# Patient Record
Sex: Male | Born: 1960 | Race: White | Hispanic: No | Smoking: Current every day smoker
Health system: Southern US, Community
[De-identification: ages and names within clinical notes are randomized; demographics above are authoritative.]

## PROBLEM LIST (undated history)

## (undated) DIAGNOSIS — I10 Essential (primary) hypertension: Secondary | ICD-10-CM

## (undated) DIAGNOSIS — B1920 Unspecified viral hepatitis C without hepatic coma: Secondary | ICD-10-CM

## (undated) DIAGNOSIS — J449 Chronic obstructive pulmonary disease, unspecified: Secondary | ICD-10-CM

## (undated) DIAGNOSIS — F1120 Opioid dependence, uncomplicated: Secondary | ICD-10-CM

## (undated) DIAGNOSIS — F1010 Alcohol abuse, uncomplicated: Secondary | ICD-10-CM

## (undated) HISTORY — PX: KNEE SURGERY: SHX244

## (undated) HISTORY — PX: CHOLECYSTECTOMY: SHX55

## (undated) HISTORY — DX: Alcohol abuse, uncomplicated: F10.10

## (undated) HISTORY — DX: Unspecified viral hepatitis C without hepatic coma: B19.20

## (undated) HISTORY — DX: Chronic obstructive pulmonary disease, unspecified: J44.9

---

## 2002-10-27 ENCOUNTER — Ambulatory Visit (HOSPITAL_COMMUNITY): Admission: RE | Admit: 2002-10-27 | Discharge: 2002-10-27 | Payer: Self-pay | Admitting: Orthopaedic Surgery

## 2002-10-27 ENCOUNTER — Encounter: Payer: Self-pay | Admitting: Orthopaedic Surgery

## 2003-11-08 ENCOUNTER — Emergency Department (HOSPITAL_COMMUNITY): Admission: EM | Admit: 2003-11-08 | Discharge: 2003-11-08 | Payer: Self-pay | Admitting: Emergency Medicine

## 2004-08-05 ENCOUNTER — Emergency Department (HOSPITAL_COMMUNITY): Admission: EM | Admit: 2004-08-05 | Discharge: 2004-08-05 | Payer: Self-pay | Admitting: Emergency Medicine

## 2007-07-14 ENCOUNTER — Ambulatory Visit (HOSPITAL_COMMUNITY): Admission: RE | Admit: 2007-07-14 | Discharge: 2007-07-14 | Payer: Self-pay | Admitting: Family Medicine

## 2010-06-22 NOTE — H&P (Signed)
NAME:  Levi Campbell, Levi Campbell NO.:  [id number]   MEDICAL RECORD NO.:  [id number]          PATIENT TYPE:  EMS   LOCATION:  [facility]                         FACILITY:  [facility]   PHYSICIAN:  Trudi Ida. Denton Lank, M.D.  DATE OF BIRTH:  20-Apr-1960   DATE OF ADMISSION:  11/08/2003  DATE OF DISCHARGE:                                HISTORY & PHYSICAL   ADDENDUM TO A T SYSTEM CHART:   DATE OF VISIT:  November 08, 2003   TIME OF RE-EVALUATION:  1435   The patient had presented with complaint of fall at work.  He indicates he  fell approximately 5 or 6 feet, landing on his back.  He indicates he had  grabbed the edge of a gutter and cut his left wrist.  Minor bleeding had  stopped with direct pressure prior to arrival to the ED.  The patient did  complain of mid back pain.  No radiation.  No leg numbness or weakness.   PHYSICAL EXAMINATION:  BACK:  Significant for diffuse tenderness in the mid  to lower thoracic spine area.  EXTREMITIES:  A laceration on the volar aspect of the left wrist.   As the patient was tender over his spine, x-rays of the thoracic spine were  ordered.  At this point, I had gone to examine other patients, waiting for  those x-ray results to return.  I also ordered that a suture tray and cart  be brought to the room so that the wound could be sutured.  Upon returning  to reevaluate the patient, including to check on the results of his x-rays,  the patient was not in the room.  The nurse at that point indicated the  patient had become agitated about the wait, had needed to smoke.  She  offered to take him outside to let him smoke and bring him right back to the  room; however, the patient had refused that offer.  She also had, by her  report, offered to come and notify the physician; however, the patient also  opted not to wait for that.  When I entered the room to check on the  patient, I found the room was vacant.  I went to the triage area and waiting  room, and  the patient was not out at there.  At that point, the nurse had  informed me that he had left the ED a few minutes prior.  Nursing at that  point also indicated that he had refused his x-rays.   The patient left the ED prior to completion of medical evaluation or  treatment against medical advice, and the patient had left the ED prior to  myself being notified that either he had refused x-rays or that he had  planned on leaving the ED prior to completion of suturing and full  evaluation.       KES/MEDQ  D:  11/08/2003  T:  11/08/2003  Job:  [id number]

## 2012-11-10 ENCOUNTER — Emergency Department (HOSPITAL_COMMUNITY)
Admission: EM | Admit: 2012-11-10 | Discharge: 2012-11-10 | Disposition: A | Discharge status: Home / Self Care | Payer: Self-pay | Attending: Emergency Medicine | Admitting: Emergency Medicine

## 2012-11-10 ENCOUNTER — Emergency Department (HOSPITAL_COMMUNITY): Payer: Self-pay

## 2012-11-10 ENCOUNTER — Encounter (HOSPITAL_COMMUNITY): Payer: Self-pay | Admitting: *Deleted

## 2012-11-10 DIAGNOSIS — Z9889 Other specified postprocedural states: Secondary | ICD-10-CM | POA: Insufficient documentation

## 2012-11-10 DIAGNOSIS — Z87828 Personal history of other (healed) physical injury and trauma: Secondary | ICD-10-CM | POA: Insufficient documentation

## 2012-11-10 DIAGNOSIS — IMO0002 Reserved for concepts with insufficient information to code with codable children: Secondary | ICD-10-CM | POA: Insufficient documentation

## 2012-11-10 DIAGNOSIS — F17200 Nicotine dependence, unspecified, uncomplicated: Secondary | ICD-10-CM | POA: Insufficient documentation

## 2012-11-10 DIAGNOSIS — M1710 Unilateral primary osteoarthritis, unspecified knee: Secondary | ICD-10-CM

## 2012-11-10 MED ORDER — DEXAMETHASONE SODIUM PHOSPHATE 4 MG/ML IJ SOLN
8.0000 mg | Freq: Once | INTRAMUSCULAR | Status: AC
  Administered 2012-11-10: 8 mg via INTRAMUSCULAR
  Filled 2012-11-10: qty 2

## 2012-11-10 MED ORDER — KETOROLAC TROMETHAMINE 10 MG PO TABS
10.0000 mg | ORAL_TABLET | Freq: Once | ORAL | Status: AC
  Administered 2012-11-10: 10 mg via ORAL
  Filled 2012-11-10: qty 1

## 2012-11-10 MED ORDER — DEXAMETHASONE 6 MG PO TABS: ORAL_TABLET | ORAL | Status: DC

## 2012-11-10 MED ORDER — DICLOFENAC SODIUM 75 MG PO TBEC: 75.0000 mg | DELAYED_RELEASE_TABLET | Freq: Two times a day (BID) | ORAL | Status: DC

## 2012-11-10 NOTE — ED Provider Notes (Signed)
CSN: [id number]     Arrival date & time 11/10/12  1427 History   First MD Initiated Contact with Patient 11/10/12 1511     Chief Complaint  Patient presents with  . Knee Pain   (Consider location/radiation/quality/duration/timing/severity/associated sxs/prior Treatment) HPI Comments: Pt is a 52 y/o male who present to the ED with c/o left knee pain. Pt has hx of injury to the left knee with tendon rupture in 1988. He has hx of arthritis. 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 No new injury. Pt c/o pain of the back and medial aspect of the knee. The pain is worse with standing or walking.  Patient is a 52 y.o. male presenting with knee pain.  Knee Pain   History reviewed. No pertinent past medical history. Past Surgical History  Procedure Laterality Date  . Cholecystectomy    . Knee surgery Left    History reviewed. No pertinent family history. History  Substance Use Topics  . Smoking status: Current Every Day Smoker -- 1.50 packs/day  . Smokeless tobacco: Not on file  . Alcohol Use: 0.0 oz/week    2-4 Cans of beer per week     Comment: daily    Review of Systems  Musculoskeletal: Positive for arthralgias.    Allergies  Codeine  Home Medications   Current Outpatient Rx  Name  Route  Sig  Dispense  Refill  . traMADol (ULTRAM) 50 MG tablet   Oral   Take 50 mg by mouth every 6 (six) hours as needed for pain.          BP 122/80  Pulse 78  Temp(Src) 97.9 F (36.6 C) (Oral)  Resp 17  Ht 5\' 10"  (1.778 m)  Wt 217 lb (98.431 kg)  BMI 31.14 kg/m2  SpO2 98% Physical Exam  Nursing note and vitals reviewed. Constitutional: He is oriented to person, place, and time. He appears well-developed and well-nourished.  Non-toxic appearance.  HENT:  Head: Normocephalic.  Right Ear: Tympanic membrane and external ear normal.  Left Ear: Tympanic membrane and external ear normal.  Eyes: EOM and lids are normal. Pupils are equal, round, and reactive to light.  Neck: Normal range  of motion. Neck supple. Carotid bruit is not present.  Cardiovascular: Normal rate, regular rhythm, normal heart sounds, intact distal pulses and normal pulses.   Pulmonary/Chest: Breath sounds normal. No respiratory distress.  Abdominal: Soft. Bowel sounds are normal. There is no tenderness. There is no guarding.  Musculoskeletal: Normal range of motion.  There is good range of motion of the left hip. There is an effusion of the left knee. There is crepitus noted of the the left knee with attempted flexion and extension. The patella is in the midline. There is tenderness but no deformity of the anterior tibial tuberosity. There is no posterior mass appreciated. There's no deformity of the tib-fib area. The Achilles tendon is intact. The dorsalis pedis pulses 2+. The left knee is warmer to touch than the right.  Lymphadenopathy:       Head (right side): No submandibular adenopathy present.       Head (left side): No submandibular adenopathy present.    He has no cervical adenopathy.  Neurological: He is alert and oriented to person, place, and time. He has normal strength. No cranial nerve deficit or sensory deficit.  Skin: Skin is warm and dry.  Psychiatric: He has a normal mood and affect. His speech is normal.    ED Course  Procedures (  including critical care time) Labs Review Labs Reviewed - No data to display Imaging Review No results found. Pulse Ox 98% on room air. WNL by my interpretation. MDM  No diagnosis found. *I have reviewed nursing notes, vital signs, and all appropriate lab and imaging results for this patient.**  X-ray of the left knee reveals narrowing of the medial joint compartment. There is spurring of the medial femoral condyle and medial tibial plateau. There is mild narrowing of the patellofemoral joint space. There is also a joint effusion present. No fracture appreciated.  Findings discussed with the patient. The plan at this time is for the patient to see Dr.  Romeo Apple, orthopedics surgery, as sone as possible for possible draining of the fluid from the knee, and also for a plan of treatment concerning the knee. The patient is given a prescription for Decadron 2 times daily, and diclofenac 2 times daily. Patient is fitted with a knee immobilizer and crutches. He is advised not to do any climbing until cleared by orthopedics.  Kathie Dike, PA-C 11/10/12 1550

## 2012-11-10 NOTE — ED Notes (Addendum)
L posterior and medial knee pain began 1 week ago.  Denies any injury.  In 1988 had arthroscopic surgery on same knee.  Early arthritis, air pocket behind patella and some tendon rupture.  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  No obvious swelling noted, but tender to palpation at medial and posterior knee.

## 2012-11-10 NOTE — ED Provider Notes (Signed)
Medical screening examination/treatment/procedure(s) were performed by non-physician practitioner and as supervising physician I was immediately available for consultation/collaboration.  Flint Melter, MD 11/10/12 [phone]

## 2014-10-03 ENCOUNTER — Other Ambulatory Visit (HOSPITAL_COMMUNITY): Payer: Self-pay | Admitting: Family Medicine

## 2014-10-03 ENCOUNTER — Ambulatory Visit (HOSPITAL_COMMUNITY)
Admission: RE | Admit: 2014-10-03 | Discharge: 2014-10-03 | Disposition: A | Discharge status: Home / Self Care | Payer: Self-pay | Source: Ambulatory Visit | Attending: Family Medicine | Admitting: Family Medicine

## 2014-10-03 DIAGNOSIS — G8929 Other chronic pain: Secondary | ICD-10-CM | POA: Insufficient documentation

## 2014-10-03 DIAGNOSIS — I70201 Unspecified atherosclerosis of native arteries of extremities, right leg: Secondary | ICD-10-CM | POA: Insufficient documentation

## 2014-10-03 DIAGNOSIS — M25561 Pain in right knee: Secondary | ICD-10-CM | POA: Insufficient documentation

## 2014-10-03 DIAGNOSIS — M25562 Pain in left knee: Secondary | ICD-10-CM | POA: Insufficient documentation

## 2014-10-03 DIAGNOSIS — M170 Bilateral primary osteoarthritis of knee: Secondary | ICD-10-CM | POA: Insufficient documentation

## 2014-10-03 DIAGNOSIS — M25511 Pain in right shoulder: Secondary | ICD-10-CM | POA: Insufficient documentation

## 2014-11-17 ENCOUNTER — Encounter: Payer: Self-pay | Admitting: Orthopedic Surgery

## 2021-02-05 ENCOUNTER — Emergency Department (HOSPITAL_COMMUNITY)
Admission: EM | Admit: 2021-02-05 | Discharge: 2021-02-05 | Disposition: A | Discharge status: Home / Self Care | Payer: Medicaid Other | Attending: Emergency Medicine | Admitting: Emergency Medicine

## 2021-02-05 ENCOUNTER — Emergency Department (HOSPITAL_COMMUNITY): Payer: Medicaid Other

## 2021-02-05 ENCOUNTER — Encounter (HOSPITAL_COMMUNITY): Payer: Self-pay | Admitting: *Deleted

## 2021-02-05 DIAGNOSIS — N452 Orchitis: Secondary | ICD-10-CM

## 2021-02-05 DIAGNOSIS — N433 Hydrocele, unspecified: Secondary | ICD-10-CM | POA: Insufficient documentation

## 2021-02-05 DIAGNOSIS — N5082 Scrotal pain: Secondary | ICD-10-CM | POA: Diagnosis present

## 2021-02-05 HISTORY — DX: Essential (primary) hypertension: I10

## 2021-02-05 LAB — URINALYSIS, ROUTINE W REFLEX MICROSCOPIC
Bilirubin Urine: NEGATIVE
Glucose, UA: NEGATIVE mg/dL
Ketones, ur: NEGATIVE mg/dL
Nitrite: NEGATIVE
Protein, ur: NEGATIVE mg/dL
Specific Gravity, Urine: 1.021 (ref 1.005–1.030)
pH: 6 (ref 5.0–8.0)

## 2021-02-05 MED ORDER — NAPROXEN 500 MG PO TABS: 500.0000 mg | ORAL_TABLET | Freq: Two times a day (BID) | ORAL | 0 refills | Status: DC

## 2021-02-05 MED ORDER — LIDOCAINE HCL (PF) 1 % IJ SOLN
1.0000 mL | Freq: Once | INTRAMUSCULAR | Status: AC
  Administered 2021-02-05: 1 mL
  Filled 2021-02-05: qty 30

## 2021-02-05 MED ORDER — KETOROLAC TROMETHAMINE 30 MG/ML IJ SOLN
15.0000 mg | Freq: Once | INTRAMUSCULAR | Status: AC
  Administered 2021-02-05: 15 mg via INTRAMUSCULAR
  Filled 2021-02-05: qty 1

## 2021-02-05 MED ORDER — DOXYCYCLINE HYCLATE 100 MG PO TABS
100.0000 mg | ORAL_TABLET | Freq: Once | ORAL | Status: AC
  Administered 2021-02-05: 100 mg via ORAL
  Filled 2021-02-05 (×2): qty 1

## 2021-02-05 MED ORDER — CEFTRIAXONE SODIUM 500 MG IJ SOLR
500.0000 mg | Freq: Once | INTRAMUSCULAR | Status: AC
  Administered 2021-02-05: 500 mg via INTRAMUSCULAR
  Filled 2021-02-05: qty 500

## 2021-02-05 MED ORDER — DOXYCYCLINE HYCLATE 100 MG PO CAPS: 100.0000 mg | ORAL_CAPSULE | Freq: Two times a day (BID) | ORAL | 0 refills | Status: DC

## 2021-02-05 NOTE — Discharge Instructions (Signed)
Take antibiotics as prescribed.  Take the entire course, even if symptoms improve. Take naproxen 2 times a day with meals.  Do not take other anti-inflammatories at the same time (Advil, Motrin, ibuprofen, Aleve). You may supplement with Tylenol if you need further pain control. Use ice packs for pain. Try and keep your scrotum elevated to help with discomfort. Follow-up with a urologist if your symptoms not proving. Return to the emergency room with any new, worsening, concerning symptoms.

## 2021-02-05 NOTE — ED Notes (Signed)
Sudden pain to scrotum for a week, pain has gotten worse per pt.  Pt denies any injury to scrotum and denies any penile discharge.

## 2021-02-05 NOTE — ED Provider Notes (Signed)
[facility] EMERGENCY DEPARTMENT Provider Note   CSN: [id number] Arrival date & time: 02/05/21  1448     History  Chief Complaint  Patient presents with   Groin Pain    Levi PeonRobert K Campbell is a 61 y.o. male presenting for evaluation of scrotal pain.  Patient states he developed acute onset left scrotal pain a week ago.  Has been constant and worsening since.  Pain is worse when he urinates.  He has not taken anything for his symptoms.  He denies fevers, chills, nausea, vomiting.  He is sexually active with 1 male partner, they do not use condoms.  She does not any symptoms.  He has never had symptoms like this before.  No penile discharge.  HPI     Home Medications Prior to Admission medications   Medication Sig Start Date End Date Taking? Authorizing Provider  doxycycline (VIBRAMYCIN) 100 MG capsule Take 1 capsule (100 mg total) by mouth 2 (two) times daily. 02/05/21  Yes Ruqayya Ventress, PA-C  naproxen (NAPROSYN) 500 MG tablet Take 1 tablet (500 mg total) by mouth 2 (two) times daily with a meal. 02/05/21  Yes Keyonni Percival, PA-C  dexamethasone (DECADRON) 6 MG tablet 1 po bid with food 11/10/12   Ivery QualeBryant, Hobson, PA-C  diclofenac (VOLTAREN) 75 MG EC tablet Take 1 tablet (75 mg total) by mouth 2 (two) times daily. 11/10/12   Ivery QualeBryant, Hobson, PA-C  traMADol (ULTRAM) 50 MG tablet Take 50 mg by mouth every 6 (six) hours as needed for pain.    [provider]      Allergies    Codeine    Review of Systems   Review of Systems  Genitourinary:  Positive for testicular pain.  All other systems reviewed and are negative.  Physical Exam Updated Vital Signs BP (!) 127/97 (BP Location: Right Arm)    Pulse 71    Temp 97.8 F (36.6 C) (Oral)    Resp 20    SpO2 100%  Physical Exam Vitals and nursing note reviewed. Exam conducted with a chaperone present.  Constitutional:      General: He is not in acute distress.    Appearance: Normal appearance.     Comments: nontoxic  HENT:      Head: Normocephalic and atraumatic.  Eyes:     Conjunctiva/sclera: Conjunctivae normal.     Pupils: Pupils are equal, round, and reactive to light.  Cardiovascular:     Rate and Rhythm: Normal rate and regular rhythm.     Pulses: Normal pulses.  Pulmonary:     Effort: Pulmonary effort is normal. No respiratory distress.     Breath sounds: Normal breath sounds. No wheezing.     Comments: Speaking in full sentences.  Clear lung sounds in all fields. Abdominal:     General: There is no distension.     Palpations: Abdomen is soft. There is no mass.     Tenderness: There is no abdominal tenderness. There is no guarding or rebound.     Comments: No TTP of the abdomen.  No rigidity, guarding, distention.  Negative CVA tenderness  Genitourinary:    Comments: Significant tenderness palpation of left testicle.  Slightly more swollen than the right.  No lesions noted.  No penile discharge.  No inguinal lymphadenopathy. Musculoskeletal:        General: Normal range of motion.     Cervical back: Normal range of motion and neck supple.  Skin:    General: Skin is warm and dry.  Capillary Refill: Capillary refill takes less than 2 seconds.  Neurological:     Mental Status: He is alert and oriented to person, place, and time.  Psychiatric:        Mood and Affect: Mood and affect normal.        Speech: Speech normal.        Behavior: Behavior normal.    ED Results / Procedures / Treatments   Labs (all labs ordered are listed, but only abnormal results are displayed) Labs Reviewed  URINALYSIS, ROUTINE W REFLEX MICROSCOPIC - Abnormal; Notable for the following components:      Result Value   APPearance HAZY (*)    Hgb urine dipstick SMALL (*)    Leukocytes,Ua TRACE (*)    Bacteria, UA RARE (*)    All other components within normal limits  URINE CULTURE  GC/CHLAMYDIA PROBE AMP ([place]) NOT AT [facility]    EKG None  Radiology US SCROTUM W/DOPPLER  Result Date:  02/05/2021 CLINICAL DATA:  Left scrotal pain. EXAM: SCROTAL ULTRASOUND DOPPLER ULTRASOUND OF THE TESTICLES TECHNIQUE: Complete ultrasound examination of the testicles, epididymis, and other scrotal structures was performed. Color and spectral Doppler ultrasound were also utilized to evaluate blood flow to the testicles. COMPARISON:  None. FINDINGS: Right testicle Measurements: 5.2 x 2.6 x 2.8 cm. No mass or microlithiasis visualized. Left testicle Measurements: 4.5 x 2.5 x 3.1 cm. No mass or microlithiasis visualized. Right epididymis:  Normal in size and appearance. Left epididymis: Normal in size and appearance. Increased vascular Doppler flow. Hydrocele:  Small left hydrocele. Varicocele:  None visualized. Pulsed Doppler interrogation of both testes demonstrates normal low resistance arterial and venous waveforms bilaterally. There is increased vascularity throughout the left testicle. IMPRESSION: 1. Findings compatible with left-sided epididymal orchitis. 2. Small left hydrocele. Electronically Signed   By: Darliss Cheney M.D.   On: 02/05/2021 16:33    Procedures Procedures    Medications Ordered in ED Medications  cefTRIAXone (ROCEPHIN) injection 500 mg (500 mg Intramuscular Given 02/05/21 1840)  lidocaine (PF) (XYLOCAINE) 1 % injection 1 mL (1 mL Other Given 02/05/21 1842)  ketorolac (TORADOL) 30 MG/ML injection 15 mg (15 mg Intramuscular Given 02/05/21 1838)  doxycycline (VIBRA-TABS) tablet 100 mg (100 mg Oral Given 02/05/21 1847)    ED Course/ Medical Decision Making/ A&P                           Medical Decision Making   This patient presents to the ED for concern of scrotal pain. This involves a number of treatment options, and is a complaint that carries with it a moderate risk of complications and morbidity.  The differential diagnosis includes torsion, orchitis/epididymitis, STI, kidney stone, UTI, hernia  Lab Tests:  I ordered, and personally interpreted labs.  The pertinent results  include: Urine no obvious signs of infection, there is only trace leukocytes and rare bacteria.  There is a small amount of blood, however low suspicion for kidney stone based on exam and history.   Imaging Studies:  I ordered imaging studies including scrotal ultrasound to rule out torsion. I independently visualized and interpreted imaging which showed no torsion.  Per radiologist, most consistent with orchitis with a small left hydrococele. I agree with the radiologist interpretation   Medicines ordered:  I ordered medication including abx and nsaids for infection and pain Reevaluation of the patient after these medicines showed that the patient stayed the same  Dispostion:  After  consideration of the diagnostic results and the patients response to treatment, I feel that the patent would benefit from management at home with antibiotics and pain control.  Patient given outpatient follow-up information with urology.  Discussed etiologies with patient including STIs, he is aware that he has results that are pending and will need to inform his partner if they are positive.  At this time, patient appears safe for discharge.  Return precautions given.  Patient states he understands and agrees to plan.  Final Clinical Impression(s) / ED Diagnoses Final diagnoses:  Orchitis  Left hydrocele    Rx / DC Orders ED Discharge Orders          Ordered    doxycycline (VIBRAMYCIN) 100 MG capsule  2 times daily        02/05/21 1826    naproxen (NAPROSYN) 500 MG tablet  2 times daily with meals        02/05/21 1826              Alveria Apley, PA-C 02/05/21 1956    Pollyann Savoy, MD 02/05/21 2147

## 2021-02-05 NOTE — ED Triage Notes (Signed)
Pain in left side of groin for over a week

## 2021-02-06 LAB — GC/CHLAMYDIA PROBE AMP (~~LOC~~) NOT AT ARMC
Chlamydia: NEGATIVE
Comment: NEGATIVE
Comment: NORMAL
Neisseria Gonorrhea: NEGATIVE

## 2021-02-07 LAB — URINE CULTURE: Culture: <10000 — AB

## 2021-04-05 ENCOUNTER — Emergency Department (HOSPITAL_COMMUNITY): Payer: Medicaid Other

## 2021-04-05 ENCOUNTER — Encounter (HOSPITAL_COMMUNITY): Payer: Self-pay

## 2021-04-05 ENCOUNTER — Other Ambulatory Visit: Payer: Self-pay

## 2021-04-05 ENCOUNTER — Emergency Department (HOSPITAL_COMMUNITY)
Admission: EM | Admit: 2021-04-05 | Discharge: 2021-04-05 | Disposition: A | Discharge status: Home / Self Care | Payer: Medicaid Other | Attending: Emergency Medicine | Admitting: Emergency Medicine

## 2021-04-05 DIAGNOSIS — W230XXA Caught, crushed, jammed, or pinched between moving objects, initial encounter: Secondary | ICD-10-CM | POA: Diagnosis not present

## 2021-04-05 DIAGNOSIS — S67190A Crushing injury of right index finger, initial encounter: Secondary | ICD-10-CM | POA: Insufficient documentation

## 2021-04-05 DIAGNOSIS — Z5321 Procedure and treatment not carried out due to patient leaving prior to being seen by health care provider: Secondary | ICD-10-CM | POA: Insufficient documentation

## 2021-04-05 HISTORY — DX: Opioid dependence, uncomplicated: F11.20

## 2021-04-05 NOTE — ED Triage Notes (Signed)
Reports smashed finger in dirty door jam and now has infection in right 2nd digit.  Patient reports went to UC and was on antibiotics for other wounds and finger started swelling yesterday and then it started draining today.  ?

## 2021-05-16 ENCOUNTER — Encounter: Payer: Self-pay | Admitting: *Deleted

## 2021-07-11 ENCOUNTER — Encounter: Payer: Self-pay | Admitting: *Deleted

## 2021-07-19 ENCOUNTER — Ambulatory Visit: Payer: Medicaid Other

## 2022-01-08 ENCOUNTER — Encounter: Payer: Self-pay | Admitting: *Deleted

## 2022-05-13 DIAGNOSIS — K621 Rectal polyp: Secondary | ICD-10-CM | POA: Insufficient documentation

## 2022-07-31 DIAGNOSIS — K5790 Diverticulosis of intestine, part unspecified, without perforation or abscess without bleeding: Secondary | ICD-10-CM | POA: Insufficient documentation

## 2023-01-08 ENCOUNTER — Ambulatory Visit (INDEPENDENT_AMBULATORY_CARE_PROVIDER_SITE_OTHER): Payer: MEDICAID | Admitting: Gastroenterology

## 2023-01-08 ENCOUNTER — Encounter: Payer: Self-pay | Admitting: Gastroenterology

## 2023-01-08 VITALS — BP 136/87 | HR 116 | Temp 97.7°F | Ht 69.0 in | Wt 235.8 lb

## 2023-01-08 DIAGNOSIS — I872 Venous insufficiency (chronic) (peripheral): Secondary | ICD-10-CM | POA: Insufficient documentation

## 2023-01-08 DIAGNOSIS — B182 Chronic viral hepatitis C: Secondary | ICD-10-CM | POA: Diagnosis not present

## 2023-01-08 DIAGNOSIS — K219 Gastro-esophageal reflux disease without esophagitis: Secondary | ICD-10-CM | POA: Insufficient documentation

## 2023-01-08 DIAGNOSIS — F17210 Nicotine dependence, cigarettes, uncomplicated: Secondary | ICD-10-CM | POA: Insufficient documentation

## 2023-01-08 DIAGNOSIS — K5900 Constipation, unspecified: Secondary | ICD-10-CM

## 2023-01-08 DIAGNOSIS — F17200 Nicotine dependence, unspecified, uncomplicated: Secondary | ICD-10-CM | POA: Insufficient documentation

## 2023-01-08 MED ORDER — FAMOTIDINE 40 MG PO TABS
40.0000 mg | ORAL_TABLET | Freq: Two times a day (BID) | ORAL | 3 refills | Status: DC
Start: 2023-01-08 — End: 2023-02-11

## 2023-01-08 NOTE — Patient Instructions (Addendum)
Follow a GERD diet:  Avoid fried, fatty, greasy, spicy, citrus foods. Avoid caffeine and carbonated beverages. Avoid chocolate. Try eating 4-6 small meals a day rather than 3 large meals. Do not eat within 3 hours of laying down. Prop head of bed up on wood or bricks to create a 6 inch incline.  Please continue to avoid any Aleve, Advil, ibuprofen, BC or Goody powders.   Please stop taking over-the-counter omeprazole.  You need to be off of this while taking Epclusa.  I have sent in famotidine for you to take 40 mg once daily.  Please try this for 1-2 weeks and if you are not seeing any improvement then you can increase to twice a day.  If after trying this for several weeks you are not having any relief please let me know so I can instruct you better on any medication that you can take for breakthrough.   On 12/19 or after you can go to the lab and have your H. pylori breath test performed.  This is to rule out a bacteria in your stomach that could be contributing to acid reflux.  We will follow-up in 2 months.  If H. pylori is negative and you continue to have acid reflux symptoms we will work towards scheduling your upper endoscopy at that time that way we can get clearance from pulmonology who you will be seeing in January.  It was a pleasure to see you today. I want to create trusting relationships with patients. If you receive a survey regarding your visit,  I greatly appreciate you taking time to fill this out on paper or through your MyChart. I value your feedback.  Brooke Bonito, MSN, FNP-BC, AGACNP-BC [facility]

## 2023-01-08 NOTE — Progress Notes (Signed)
GI Office Note    Referring Provider: Toma Deiters, MD Primary Care Physician:  Toma Deiters, MD  Primary Gastroenterologist: Hennie Duos. Marletta Lor, DO  Chief Complaint   Chief Complaint  Patient presents with   Gastroesophageal Reflux    Heart burn all the time    History of Present Illness   Levi Campbell is a 62 y.o. male presenting today at the request of Hasanaj, Myra Gianotti, MD for GERD.   Per review of referral paperwork he has noted a medical history of COPD, not currently seeing pulmonology, chronic pain, and hypertension.  Did admit to marijuana use.  Has used pain medications off the street for pain management since he did not have a chronic pain provider.  Advised to see GI in regards to refractory reflux given he is to start antiviral's for hep C treatment per these notes he is awaiting pharmacy pre approval from pharmacy to see which medication is covered.  He was also referred to pulmonology and advised to start an albuterol inhaler as needed and Symbicort daily.  Given pneumonia vaccination.   Colonoscopy 07/09/2022 by Dr. Marcha Solders: -Hemorrhoids and perianal exam -Sigmoid and descending diverticulosis -Redundant colon -6 small polyps removed from the colon -1 diminutive polyp in the distal rectum removed\ -Non-bleeding internal hemorrhoids -Advised Metamucil 1 teaspoon twice daily -Pathology revealed hyperplastic polyps -He was recommended to have a 10-year repeat  Today:  He has tried zantac, ranitidine until taken off the market because it worked very well for him. He took Zantac in the 90s and had that prescription for a while and then took otc. For 3-4 days he can do well while tacking it. Has been tacking otc omeprazole for about 2 months. He has taken pantoprazole before as well in the past but it was not his. He admits to lots of fried foods. Avoids spicy foods. [name], [name]   Does not drink or do drugs like he used. Has been sober about a year for alcohol and has been about 3 years for other drugs. He does admit to marijuana. He does admit to smoking. He also has chronic pain. Has a lot of joint pain and arthritis. He states his father had chronic GERD as well.   Does have some intermittent constipation.  Has anywhere from [facility] 1-5 stools.  Does not go every day, sometimes every other day.  Is on chronic opioids.  Wt Readings from Last 3 Encounters:  01/08/23 235 lb 12.8 oz (107 kg)  04/05/21 161 lb (73 kg)  11/10/12 217 lb (98.4 kg)    Current Outpatient Medications  Medication Sig Dispense Refill   BELBUCA 450 MCG FILM SMARTSIG:1 Strip(s) By Mouth Every 12 Hours     cetirizine (ZYRTEC) 10 MG tablet TAKE 1 TABLET BY MOUTH DAILY for 30     famotidine (PEPCID) 40 MG tablet Take 1 tablet (40 mg total) by mouth 2 (two) times daily. 60 tablet 3   lisinopril (ZESTRIL) 20 MG tablet TAKE 1 TABLET BY MOUTH DAILY for 30     OVER THE COUNTER MEDICATION Instaflex joint support     Sofosbuvir-Velpatasvir (EPCLUSA) 400-100 MG TABS 1 tablet Orally Once a day for 90 days  traMADol (ULTRAM) 50 MG tablet Take 50 mg by mouth every 6 (six) hours as needed for pain. (Patient not taking: Reported on 01/08/2023)     No current facility-administered medications for this visit.    Past Medical History:  Diagnosis Date   Alcohol abuse    quit 1 year ago   COPD (chronic obstructive pulmonary disease) (HCC)    Hepatitis C infection    Hypertension    Opiate addiction (HCC)     Past Surgical History:  Procedure Laterality Date   CHOLECYSTECTOMY     KNEE SURGERY Left     Family History  Problem Relation Age of Onset   Stroke Mother    Cancer - Ovarian Mother    Colitis Mother    Heart attack Father    Venous  thrombosis Sister    Heart failure Sister    Other Sister        gastric bypass x2   Alcohol abuse Sister    Kidney cancer Brother    Bladder Cancer Brother     Allergies as of 01/08/2023 - Review Complete 01/08/2023  Allergen Reaction Noted   Codeine Hives 11/10/2012    Social History   Socioeconomic History   Marital status: Widowed    Spouse name: Not on file   Number of children: Not on file   Years of education: Not on file   Highest education level: Not on file  Occupational History   Not on file  Tobacco Use   Smoking status: Every Day    Current packs/day: 1.50    Types: Cigarettes   Smokeless tobacco: Not on file  Vaping Use   Vaping status: Former  Substance and Sexual Activity   Alcohol use: Not Currently    Alcohol/week: 2.0 - 4.0 standard drinks of alcohol    Types: 2 - 4 Cans of beer per week    Comment: daily   Drug use: Yes    Types: Marijuana, Methamphetamines, Cocaine    Comment: occassional   Sexual activity: Yes    Birth control/protection: None  Other Topics Concern   Not on file  Social History Narrative   Not on file   Social Determinants of Health   Financial Resource Strain: Not on file  Food Insecurity: Not on file  Transportation Needs: Not on file  Physical Activity: Not on file  Stress: Not on file  Social Connections: Unknown (09/05/2021)   [facility] from [facility]   Social Network    Social Network: Not on file  Intimate Partner Violence: Unknown (09/05/2021)   Received from [facility]   HITS    Physically Hurt: Not on file    Insult or Talk Down To: Not on file    Threaten Physical Harm: Not on file    Scream or Curse: Not on file     Review of Systems   Gen: Denies any fever, chills, fatigue, weight loss, lack of appetite.  CV: Denies chest pain, heart palpitations, peripheral edema, syncope.  Resp: + shortness of breath, + wheezing. Denies cough.  GI: see HPI GU : Denies urinary burning, urinary frequency,  urinary hesitancy MS: + joint pain. Denies muscle weakness, cramps.  Derm: Denies rash, itching, dry skin Psych: Denies depression, anxiety, memory loss, and confusion Heme: Denies bruising, bleeding, and enlarged lymph nodes.  Physical Exam   BP 136/87 (BP Location: Right Arm, Patient Position: Sitting, Cuff Size: Large)   Pulse (!) 116   Temp 97.7 F (36.5 C) (  Temporal)   Ht 5\' 9"  (1.753 m)   Wt 235 lb 12.8 oz (107 kg)   BMI 34.82 kg/m   General:   Alert and oriented. Pleasant and cooperative. Well-nourished and well-developed.  Head:  Normocephalic and atraumatic. Eyes:  Without icterus, sclera clear and conjunctiva pink.  Ears:  Normal auditory acuity. Lungs:  Clear to auscultation bilaterally. No wheezes, rales, or rhonchi. No distress.  Heart:  S1, S2 present without murmurs appreciated.  Abdomen:  +BS, soft, non-tender and non-distended. No HSM noted. No guarding or rebound. No masses appreciated.  Rectal:  Deferred  Msk:  Symmetrical without gross deformities. Normal posture. Neurologic:  Alert and  oriented x4;  grossly normal neurologically. Psych:  Alert and cooperative. Normal mood and affect.  Assessment   Levi Campbell is a 62 y.o. male with a history of recently diagnosed COPD, HTN, tobacco abuse, venous insufficiency, chronic pain, hepatitis C awaiting to start treatment presenting today for evaluation of GERD.  GERD, dysphagia: Patient has had chronic acid reflux.  Has taken over-the-counter omeprazole as well as pantoprazole before in the past.  He reports ranitidine used to work before it was recalled and pantoprazole prescription strength was helpful before but currently having frequent breakthrough symptoms on over-the-counter omeprazole.  Given he is about to start Epclusa he needs alternative therapy therefore we will start on famotidine 40 mg once daily and increase to twice daily if needed.  If he has refractory symptoms we may consider over-the-counter  Maalox but not within 4 hours of administration of Epclusa.  Ultimately would like to evaluate with an upper endoscopy in the near future however given his COPD I would like for him to see pulmonology first.  We will rule out H. pylori with a breath test in 2 weeks.  If he is positive we will have to wait until after Epclusa therapy is finished and then treat accordingly.  Constipation: Most likely opioid induced.  Does take Dulcolax from time to time.  Stools described as [place] 1-5 and usually bowel movement every other day but does have to strain at times.  Not currently taking anything regularly over-the-counter.  Advised him to start with MiraLAX 17 g once daily in 8 ounces of water.  Ultimately may need some stronger prescription therapy with Linzess, Amitiza, or Movantik in the future.   Hepatitis C: Proceed with Epclusa therapy prescribed by PCP for chronic hepatitis C.  PLAN   H. Pylori breath test in 2 weeks.  Famotidine 40 mg once daily. Can increase to twice daily if needed.  Begin Epclusa as prescribed by PCP Start MiraLAX 17 g once daily in 8 ounces of water. GERD diet. Follow up in 2 months. Discuss EGD at that time.   Brooke Bonito, MSN, FNP-BC, AGACNP-BC [facility]

## 2023-02-09 NOTE — Progress Notes (Signed)
 Levi Campbell, male    DOB: 1961-01-25    MRN: [id number]   Brief patient profile:  63  yowm active smoker  referred to pulmonary clinic in [place]  02/11/2023 by Charmaine Melia NP   for doe onset in 1990s  using prn saba  initially then started on symbicort which even on 80 strength bothers throat along with overt HB      History of Present Illness  02/11/2023  Pulmonary/ 1st office eval/ Lenah Messenger / [place] Office maint on symbicot 80 2bid / ACEi  Chief Complaint  Patient presents with   Establish Care   Shortness of Breath  Dyspnea:  more limited by legs / hips than breathing at present   Cough: variable with temp but really 24/7 yellow and occ blood streaks  Sleep: bed is flat, 3 pillows under head 3/7 nights coughing  SABA use: none today  02: none  LDSCT:ordered  Bad HB best rx omeprazole 20  did no think  pepcid  rec by gi worked as well   No obvious day to day or daytime pattern/variability or assoc excess/ purulent sputum or mucus plugs   or cp or chest tightness, subjective wheeze or overt sinus  symptoms.    Also denies any obvious fluctuation of symptoms with weather or environmental changes or other aggravating or alleviating factors except as outlined above   No unusual exposure hx or h/o childhood pna/ asthma or knowledge of premature birth.  Current Allergies, Complete Past Medical History, Past Surgical History, Family History, and Social History were reviewed in [employer] record.  ROS  The following are not active complaints unless bolded Hoarseness, sore throat, dysphagia, dental problems, itching, sneezing,  nasal congestion or discharge of excess mucus or purulent secretions, ear ache,   fever, chills, sweats, unintended wt loss or wt gain, classically pleuritic or exertional cp,  orthopnea pnd or arm/hand swelling  or leg swelling, presyncope, palpitations, abdominal pain, anorexia, nausea, vomiting, diarrhea  or change in bowel habits  or change in bladder habits, change in stools or change in urine, dysuria, hematuria,  rash, arthralgias, visual complaints, headache, numbness, weakness or ataxia or problems with walking or coordination,  change in mood or  memory.            Outpatient Medications Prior to Visit  Medication Sig Dispense Refill   BELBUCA 450 MCG FILM SMARTSIG:1 Strip(s) By Mouth Every 12 Hours     cetirizine (ZYRTEC) 10 MG tablet TAKE 1 TABLET BY MOUTH DAILY for 30     lisinopril (ZESTRIL) 20 MG tablet TAKE 1 TABLET BY MOUTH DAILY for 30     naloxone (NARCAN) nasal spray 4 mg/0.1 mL Place 1 spray into the nose once.     omeprazole (PRILOSEC) 20 MG capsule Take 20 mg by mouth. TAKES EVERY 3RD DAY     OVER THE COUNTER MEDICATION Instaflex joint support     Sofosbuvir-Velpatasvir (EPCLUSA) 400-100 MG TABS 1 tablet Orally Once a day for 90 days     SYMBICORT 80-4.5 MCG/ACT inhaler Inhale 2 puffs into the lungs daily.     VENTOLIN  HFA 108 (90 Base) MCG/ACT inhaler Inhale 1 puff into the lungs every 4 (four) hours.     famotidine  (PEPCID ) 40 MG tablet Take 1 tablet (40 mg total) by mouth 2 (two) times daily. (Patient not taking: Reported on 02/11/2023) 60 tablet 3   traMADol (ULTRAM) 50 MG tablet Take 50 mg by mouth every 6 (six) hours as  needed for pain. (Patient not taking: Reported on 01/08/2023)     No facility-administered medications prior to visit.    Past Medical History:  Diagnosis Date   Alcohol abuse    quit 1 year ago   COPD (chronic obstructive pulmonary disease) (HCC)    Hepatitis C infection    Hypertension    Opiate addiction (HCC)       Objective:     BP (!) 147/81   Pulse (!) 111   Ht 5' 9 (1.753 m)   Wt 234 lb (106.1 kg)   SpO2 92%   BMI 34.56 kg/m   SpO2: 92 %  Amb wm slt gruff voice / nad     HEENT : Oropharynx  clear/ full dentures        NECK :  without  apparent JVD/ palpable Nodes/TM    LUNGS: no acc muscle use,  Min barrel  contour chest wall with bilateral   exp rhonchi  wheeze and  without cough on insp or exp maneuvers and min  Hyperresonant  to  percussion bilaterally    CV:  RRR  no s3 or murmur or increase in P2, and no edema   ABD:  obese soft and nontender with pos end  insp Hoover's  in the supine position.  No bruits or organomegaly appreciated   MS:  Nl gait/ ext warm without deformities Or obvious joint restrictions  calf tenderness, cyanosis or clubbing     SKIN: warm and dry without lesions    NEURO:  alert, approp, nl sensorium with  no motor or cerebellar deficits apparent.        CXR PA and Lateral:   02/11/2023 :    I personally reviewed images and impression is as follows:     Mild increase markings/ mild CM/ no acute findings      Assessment   COPD GOLD ? /  active smoker Active smoker on ACEi 02/11/2023 > d/c'd  - Allergy screen 02/11/2023 >  Eos 0. /  IgE  /    alpha one AT phenotype   He predominantly has AB from smoking and I don't think it is bad copd though does have difficult to control coughing fits so:  1) Try off ACEi (see separate a/p)  2) Symbicort 80  up to 2 every 12 hours  (says gets burning if takes max dose so ok to use prn for now since cough > sob in terms of concern 3)GERD rx per GI 4) mucinex dm up to 1200 mg biid prn   F/u in 6 weeks with pfts on return     Essential hypertension D/c ACEi 02/11/2023  for cough   In the best review of chronic cough to date ( NEJM 2016 375 8455-8448) ,  ACEi are now felt to cause cough in up to  20% of pts which is a 4 fold increase from previous reports and does not include the variety of non-specific complaints we see in pulmonary clinic in pts on ACEi but previously attributed to another dx like  Copd/asthma and  include PNDS, throat and chest congestion, bronchitis, unexplained dyspnea and noct strangling sensations, and hoarseness, but also  atypical /refractory GERD symptoms like dysphagia and bad heartburn   The only way I know  to prove this is not an  ACEi Case is a trial off ACEi x a minimum of 6 weeks then regroup.   >>>  check baseline bmet >>>  try olmesartan  j20 mg  daily x 6 weeks           Each maintenance medication was reviewed in detail including emphasizing most importantly the difference between maintenance and prns and under what circumstances the prns are to be triggered using an action plan format where appropriate.  Total time for H and P, chart review, counseling, reviewing hfa  device(s) and generating customized AVS unique to this office visit / same day charting  > 45 min new pt eval        Cigarette smoker 4-5 min discussion re active cigarette smoking in addition to office E&M  Ask about tobacco use:   ongoing  Advise quitting   I took an extended  opportunity with this patient to outline the consequences of continued cigarette use  in airway disorders based on all the data we have from the multiple national lung health studies (perfomed over decades at millions of dollars in cost)  indicating that smoking cessation, not choice of inhalers or pulmonary physicians, is the most important aspect of his care.   Assess willingness:  Not committed at this point Assist in quit attempt:  Per PCP when ready Arrange follow up:   Follow up per Primary Care planned   Low-dose CT lung cancer screening is recommended for patients who are 99-3 years of age with a 20+ pack-year history of smoking and who are currently smoking or quit <=15 years ago. No coughing up blood  No unintentional weight loss of > 15 pounds in the last 6 months - pt is eligible for scanning yearly until  80 y p quits > referred                 Ozell America, MD 02/11/2023

## 2023-02-11 ENCOUNTER — Ambulatory Visit (HOSPITAL_COMMUNITY)
Admission: RE | Admit: 2023-02-11 | Discharge: 2023-02-11 | Disposition: A | Discharge status: Home / Self Care | Payer: MEDICAID | Source: Ambulatory Visit | Attending: Internal Medicine | Admitting: Internal Medicine

## 2023-02-11 ENCOUNTER — Encounter: Payer: Self-pay | Admitting: Internal Medicine

## 2023-02-11 ENCOUNTER — Ambulatory Visit (INDEPENDENT_AMBULATORY_CARE_PROVIDER_SITE_OTHER): Payer: MEDICAID | Admitting: Internal Medicine

## 2023-02-11 VITALS — BP 147/81 | HR 111 | Ht 69.0 in | Wt 234.0 lb

## 2023-02-11 DIAGNOSIS — I10 Essential (primary) hypertension: Secondary | ICD-10-CM

## 2023-02-11 DIAGNOSIS — J449 Chronic obstructive pulmonary disease, unspecified: Secondary | ICD-10-CM | POA: Insufficient documentation

## 2023-02-11 DIAGNOSIS — F17210 Nicotine dependence, cigarettes, uncomplicated: Secondary | ICD-10-CM | POA: Diagnosis not present

## 2023-02-11 MED ORDER — OLMESARTAN MEDOXOMIL 20 MG PO TABS: 20.0000 mg | ORAL_TABLET | Freq: Every day | ORAL | 11 refills | Status: AC

## 2023-02-11 NOTE — Assessment & Plan Note (Signed)
 4-5 min discussion re active cigarette smoking in addition to office E&M  Ask about tobacco use:   ongoing Advise quitting   I took an extended  opportunity with this patient to outline the consequences of continued cigarette use  in airway disorders based on all the data we have from the multiple national lung health studies (perfomed over decades at millions of dollars in cost)  indicating that smoking cessation, not choice of inhalers or pulmonary physicians, is the most important aspect of his care.   Assess willingness:  Not committed at this point Assist in quit attempt:  Per PCP when ready Arrange follow up:   Follow up per Primary Care planned   Low-dose CT lung cancer screening is recommended for patients who are 35-66 years of age with a 20+ pack-year history of smoking and who are currently smoking or quit <=15 years ago. No coughing up blood  No unintentional weight loss of > 15 pounds in the last 6 months - pt is eligible for scanning yearly until 15 y p quits > referred

## 2023-02-11 NOTE — Assessment & Plan Note (Addendum)
 D/c ACEi 02/11/2023  for cough   In the best review of chronic cough to date ( NEJM 2016 375 8455-8448) ,  ACEi are now felt to cause cough in up to  20% of pts which is a 4 fold increase from previous reports and does not include the variety of non-specific complaints we see in pulmonary clinic in pts on ACEi but previously attributed to another dx like  Copd/asthma and  include PNDS, throat and chest congestion, bronchitis, unexplained dyspnea and noct strangling sensations, and hoarseness, but also  atypical /refractory GERD symptoms like dysphagia and bad heartburn   The only way I know  to prove this is not an ACEi Case is a trial off ACEi x a minimum of 6 weeks then regroup.   >>>  check baseline bmet >>>  try olmesartan  j20 mg daily x 6 weeks           Each maintenance medication was reviewed in detail including emphasizing most importantly the difference between maintenance and prns and under what circumstances the prns are to be triggered using an action plan format where appropriate.  Total time for H and P, chart review, counseling, reviewing hfa  device(s) and generating customized AVS unique to this office visit / same day charting  > 45 min new pt eval

## 2023-02-11 NOTE — Patient Instructions (Addendum)
 Stop lisinopril and replace with olmesartan  20 mg one daily   My office will be contacting you by phone for referral to lung cancer sreeening  [phone]   and PFTs - if you don't hear back from my office within one week please call us  back or notify us  thru MyChart and we'll address it right away.   For cough/ congestion > mucinex dm 1200 mg every 12 hours as needed    Work on inhaler technique:  relax and gently blow all the way out then take a nice smooth full deep breath back in, triggering the inhaler at same time you start breathing in.  Hold breath in for at least  5 seconds if you can. Blow out symbicort  80 thru nose. Rinse and gargle with water when done.  If mouth or throat bother you at all,  try brushing teeth/gums/tongue with arm and hammer toothpaste/ make a slurry and gargle and spit out.   Your can use the Symbicort 80  up to 2 puffs every 12 hours   Please remember to go to the lab department   for your tests - we will call you with the results when they are available.      Please remember to go to the  x-ray department  @  [facility] for your tests - we will call you with the results when they are available      The key is to stop smoking completely before smoking completely stops you!   Please schedule a follow up office visit in 6 weeks, call sooner if needed

## 2023-02-11 NOTE — Assessment & Plan Note (Addendum)
 Active smoker on ACEi 02/11/2023 > d/c'd  - Allergy screen 02/11/2023 >  Eos 0. /  IgE  /    alpha one AT phenotype   He predominantly has AB from smoking and I don't think it is bad copd though does have difficult to control coughing fits so:  1) Try off ACEi (see separate a/p)  2) Symbicort 80  up to 2 every 12 hours  (says gets burning if takes max dose so ok to use prn for now since cough > sob in terms of concern 3)GERD rx per GI 4) mucinex dm up to 1200 mg biid prn   F/u in 6 weeks with pfts on return

## 2023-02-25 LAB — CBC WITH DIFFERENTIAL/PLATELET
Basophils Absolute: 0.1 10*3/uL (ref 0.0–0.2)
Basos: 1 %
EOS (ABSOLUTE): 0.1 10*3/uL (ref 0.0–0.4)
Eos: 1 %
Hematocrit: 51.2 % — ABNORMAL HIGH (ref 37.5–51.0)
Hemoglobin: 17 g/dL (ref 13.0–17.7)
Immature Grans (Abs): 0 10*3/uL (ref 0.0–0.1)
Immature Granulocytes: 1 %
Lymphocytes Absolute: 2.9 10*3/uL (ref 0.7–3.1)
Lymphs: 35 %
MCH: 29 pg (ref 26.6–33.0)
MCHC: 33.2 g/dL (ref 31.5–35.7)
MCV: 87 fL (ref 79–97)
Monocytes Absolute: 0.7 10*3/uL (ref 0.1–0.9)
Monocytes: 8 %
Neutrophils Absolute: 4.7 10*3/uL (ref 1.4–7.0)
Neutrophils: 54 %
Platelets: 293 10*3/uL (ref 150–450)
RBC: 5.86 x10E6/uL — ABNORMAL HIGH (ref 4.14–5.80)
RDW: 12.7 % (ref 11.6–15.4)
WBC: 8.5 10*3/uL (ref 3.4–10.8)

## 2023-02-25 LAB — BASIC METABOLIC PANEL
BUN/Creatinine Ratio: 7 — ABNORMAL LOW (ref 10–24)
BUN: 7 mg/dL — ABNORMAL LOW (ref 8–27)
CO2: 23 mmol/L (ref 20–29)
Calcium: 9.8 mg/dL (ref 8.6–10.2)
Chloride: 96 mmol/L (ref 96–106)
Creatinine, Ser: 0.95 mg/dL (ref 0.76–1.27)
Glucose: 113 mg/dL — ABNORMAL HIGH (ref 70–99)
Potassium: 4.3 mmol/L (ref 3.5–5.2)
Sodium: 135 mmol/L (ref 134–144)
eGFR: 92 mL/min/{1.73_m2} (ref >59)

## 2023-02-25 LAB — ALPHA-1-ANTITRYPSIN PHENOTYP: A-1 Antitrypsin: 249 mg/dL — ABNORMAL HIGH (ref 101–187)

## 2023-03-12 NOTE — Progress Notes (Deleted)
 GI Office Note    Referring Provider: Alston Silvio BROCKS, FNP Primary Care Physician:  Bucio, Elsa C, FNP Primary Gastroenterologist: Carlin POUR. Cindie, DO  Date:  03/12/2023  ID:  Levi Campbell, DOB 08/26/1960, MRN [id number]   Chief Complaint   No chief complaint on file.   History of Present Illness  Levi Campbell is a 63 y.o. male with a history of GERD, COPD, HTN, tobacco abuse, venous insufficiency, chronic pain, and hepatitis C presenting today for follow-up of reflux and to discuss possible cirrhosis.  Per review of referral paperwork he has noted a medical history of COPD, not currently seeing pulmonology, chronic pain, and hypertension.  Did admit to marijuana use.  Has used pain medications off the street for pain management since he did not have a chronic pain provider.  Advised to see GI in regards to refractory reflux given he is to start antiviral's for hep C treatment per these notes he is awaiting pharmacy pre approval from pharmacy to see which medication is covered.  He was also referred to pulmonology and advised to start an albuterol  inhaler as needed and Symbicort daily.  Given pneumonia vaccination.  Colonoscopy 07/09/2022 by Dr. Maranda: -Hemorrhoids and perianal exam -Sigmoid and descending diverticulosis -Redundant colon -6 small polyps removed from the colon -1 diminutive polyp in the distal rectum removed\ -Non-bleeding internal hemorrhoids -Advised Metamucil 1 teaspoon twice daily -Pathology revealed hyperplastic polyps -He was recommended to have a 10-year repeat  Last office visit 01/08/23.  Noted chronic reflux.  Had taken Zantac as well as ranitidine until taken off the market.  States this worked really well for him.  Took Zantac over-the-counter for a long time and then took other over-the-counter's.  Had been taking over-the-counter omeprazole for about 2 months.  States he believes he has taken pantoprazole before in the past as well.  Does admit to  eating lots of fried foods but does avoid spicy foods.  Has taken diclofenac  in the past but denies any recent frequent NSAID use.  Reported a good appetite.  Has had some feelings of throat tightening up in the past with swallowing.  Noted to be sober for about a year from alcohol, sober for 3 years from other drug use.  Did note some intermittent constipation as well.  Most the time has a bowel movement every other day, and is on chronic opioids.  Noted to be on Suboxone.  Discussed seeing pulmonology prior to scheduling upper endoscopy.  Advised H. pylori breath test.  Use famotidine  40 mg once daily.  Advised to start Epclusa given by PCP.  Start MiraLAX daily as well for constipation.  RUQ US  10/21/2022: -Mild nodular contour liver, may represent cirrhosis -CBD 7 mm  H. pylori breath test has not yet been performed.  Labs 02/11/2023: Elevated A-1 antitrypsin.  Hemoglobin 17, platelets 293.  Creatinine 0.95, sodium 135  Today:  Hep C -currently on Epclusa?    Wt Readings from Last 3 Encounters:  02/11/23 234 lb (106.1 kg)  01/08/23 235 lb 12.8 oz (107 kg)  04/05/21 161 lb (73 kg)    Current Outpatient Medications  Medication Sig Dispense Refill   BELBUCA 450 MCG FILM SMARTSIG:1 Strip(s) By Mouth Every 12 Hours     cetirizine (ZYRTEC) 10 MG tablet TAKE 1 TABLET BY MOUTH DAILY for 30     naloxone (NARCAN) nasal spray 4 mg/0.1 mL Place 1 spray into the nose once.     olmesartan  (BENICAR ) 20 MG  tablet Take 1 tablet (20 mg total) by mouth daily. 30 tablet 11   omeprazole (PRILOSEC) 20 MG capsule Take 20 mg by mouth. TAKES EVERY 3RD DAY     OVER THE COUNTER MEDICATION Instaflex joint support     Sofosbuvir-Velpatasvir (EPCLUSA) 400-100 MG TABS 1 tablet Orally Once a day for 90 days     SYMBICORT 80-4.5 MCG/ACT inhaler Inhale 2 puffs into the lungs daily.     VENTOLIN  HFA 108 (90 Base) MCG/ACT inhaler Inhale 1 puff into the lungs every 4 (four) hours.     No current facility-administered  medications for this visit.    Past Medical History:  Diagnosis Date   Alcohol abuse    quit 1 year ago   COPD (chronic obstructive pulmonary disease) (HCC)    Hepatitis C infection    Hypertension    Opiate addiction (HCC)     Past Surgical History:  Procedure Laterality Date   CHOLECYSTECTOMY     KNEE SURGERY Left     Family History  Problem Relation Age of Onset   Stroke Mother    Cancer - Ovarian Mother    Colitis Mother    Heart attack Father    Venous thrombosis Sister    Heart failure Sister    Other Sister        gastric bypass x2   Alcohol abuse Sister    Kidney cancer Brother    Bladder Cancer Brother     Allergies as of 03/13/2023 - Review Complete 02/11/2023  Allergen Reaction Noted   Tape Dermatitis, Itching, and Rash 06/08/2022   Codeine Hives 11/10/2012    Social History   Socioeconomic History   Marital status: Widowed    Spouse name: Not on file   Number of children: Not on file   Years of education: Not on file   Highest education level: Not on file  Occupational History   Not on file  Tobacco Use   Smoking status: Every Day    Current packs/day: 1.50    Types: Cigarettes   Smokeless tobacco: Not on file  Vaping Use   Vaping status: Former  Substance and Sexual Activity   Alcohol use: Not Currently    Alcohol/week: 2.0 - 4.0 standard drinks of alcohol    Types: 2 - 4 Cans of beer per week    Comment: daily   Drug use: Yes    Types: Marijuana, Methamphetamines, Cocaine    Comment: occassional   Sexual activity: Yes    Birth control/protection: None  Other Topics Concern   Not on file  Social History Narrative   Not on file   Social Drivers of Health   Financial Resource Strain: Not on file  Food Insecurity: Not on file  Transportation Needs: Not on file  Physical Activity: Not on file  Stress: Not on file  Social Connections: Unknown (09/05/2021)   Received from [facility]   Social Network    Social Network: Not on  file     Review of Systems   Gen: Denies fever, chills, anorexia. Denies fatigue, weakness, weight loss.  CV: Denies chest pain, palpitations, syncope, peripheral edema, and claudication. Resp: Denies dyspnea at rest, cough, wheezing, coughing up blood, and pleurisy. GI: See HPI Derm: Denies rash, itching, dry skin Psych: Denies depression, anxiety, memory loss, confusion. No homicidal or suicidal ideation.  Heme: Denies bruising, bleeding, and enlarged lymph nodes.  Physical Exam   There were no vitals taken for this visit.  General:  Alert and oriented. No distress noted. Pleasant and cooperative.  Head:  Normocephalic and atraumatic. Eyes:  Conjuctiva clear without scleral icterus. Mouth:  Oral mucosa pink and moist. Good dentition. No lesions. Lungs:  Clear to auscultation bilaterally. No wheezes, rales, or rhonchi. No distress.  Heart:  S1, S2 present without murmurs appreciated.  Abdomen:  +BS, soft, non-tender and non-distended. No rebound or guarding. No HSM or masses noted. Rectal: *** Msk:  Symmetrical without gross deformities. Normal posture. Extremities:  Without edema. Neurologic:  Alert and  oriented x4 Psych:  Alert and cooperative. Normal mood and affect.  Assessment  Levi Campbell is a 63 y.o. male with a history of *** presenting today with   GERD, dysphagia:  Constipation:  Hepatitis C:  Cirrhosis:  PLAN   *** Fibrosure, ELF, AFP, INR, HFP? EGD with dilation? Continue Epclusa? Continue MiraLAX? GERD diet Continue famotidine  ***    Charmaine Melia, MSN, FNP-BC, AGACNP-BC [facility]

## 2023-03-13 ENCOUNTER — Ambulatory Visit: Payer: MEDICAID | Admitting: Gastroenterology

## 2023-03-14 ENCOUNTER — Encounter: Payer: Self-pay | Admitting: Gastroenterology

## 2023-03-30 NOTE — Progress Notes (Deleted)
 Levi Campbell, male    DOB: [birth year]    MRN: [id number]   Brief patient profile:  55  yowm active smoker  referred to pulmonary clinic in [place]  02/11/2023 by Brooke Bonito NP   for doe onset in 1990s  using prn saba  initially then started on symbicort which even on 80 strength bothers throat along with overt HB      History of Present Illness  02/11/2023  Pulmonary/ 1st office eval/ Doyl Bitting / [place] [facility] maint on symbicot 80 2bid / ACEi  Chief Complaint  Patient presents with   Establish Care   Shortness of Breath  Dyspnea:  more limited by legs / hips than breathing at present   Cough: variable with temp but really 24/7 yellow and occ blood streaks  Sleep: bed is flat, 3 pillows under head 3/7 nights coughing  SABA use: none today  02: none  LDSCT:ordered  Bad HB best rx omeprazole 20  did no think  pepcid rec by gi worked as well  Rec Stop lisinopril and replace with olmesartan 20 mg one daily  My office will be contacting you by phone for referral to lung cancer sreeening  [phone]   and PFTs > neither done by 04/01/2023  - if you don't hear back from my office within one week please call us back or notify us thru MyChart and we'll address it right away.  For cough/ congestion > mucinex dm 1200 mg every 12 hours as needed  Work on inhaler technique:   You can use the Symbicort 80  up to 2 puffs every 12 hours    Allergy screen 02/11/2023 >  Eos 0.1   Alpha one AT phenotype MM  level 249  CXR ok     The key is to stop smoking completely before smoking completely stops you!   Please schedule a follow up office visit in 6 weeks, call sooner if needed    04/01/2023  f/u ov/[place] office/Markes Shatswell re: *** maint on ***  No chief complaint on file.   Dyspnea:  *** Cough: *** Sleeping: ***   resp cc  SABA use: *** 02: ***  Lung cancer screening: ***   No obvious day to day or daytime variability or assoc excess/ purulent sputum or mucus plugs or hemoptysis  or cp or chest tightness, subjective wheeze or overt sinus or hb symptoms.    Also denies any obvious fluctuation of symptoms with weather or environmental changes or other aggravating or alleviating factors except as outlined above   No unusual exposure hx or h/o childhood pna/ asthma or knowledge of premature birth.  Current Allergies, Complete Past Medical History, Past Surgical History, Family History, and Social History were reviewed in [employer] record.  ROS  The following are not active complaints unless bolded Hoarseness, sore throat, dysphagia, dental problems, itching, sneezing,  nasal congestion or discharge of excess mucus or purulent secretions, ear ache,   fever, chills, sweats, unintended wt loss or wt gain, classically pleuritic or exertional cp,  orthopnea pnd or arm/hand swelling  or leg swelling, presyncope, palpitations, abdominal pain, anorexia, nausea, vomiting, diarrhea  or change in bowel habits or change in bladder habits, change in stools or change in urine, dysuria, hematuria,  rash, arthralgias, visual complaints, headache, numbness, weakness or ataxia or problems with walking or coordination,  change in mood or  memory.        No outpatient medications have been marked as  taking for the 04/01/23 encounter (Appointment) with Nyoka Cowden, MD.           Past Medical History:  Diagnosis Date   Alcohol abuse    quit 1 year ago   COPD (chronic obstructive pulmonary disease) (HCC)    Hepatitis C infection    Hypertension    Opiate addiction (HCC)       Objective:    Vital signs reviewed  04/01/2023  - Note at rest 02 sats  ***% on ***   General appearance:    ***     04/01/2023       ***  02/11/23 234 lb (106.1 kg)  01/08/23 235 lb 12.8 oz (107 kg)  04/05/21 161 lb (73 kg)      Vital signs reviewed  04/01/2023  - Note at rest 02 sats  ***% on ***   General appearance:    ***     Min barr ***     Assessment

## 2023-04-01 ENCOUNTER — Ambulatory Visit (HOSPITAL_COMMUNITY): Admission: RE | Admit: 2023-04-01 | Payer: MEDICAID | Source: Ambulatory Visit

## 2023-04-01 ENCOUNTER — Encounter: Payer: Self-pay | Admitting: Internal Medicine

## 2023-04-01 ENCOUNTER — Ambulatory Visit: Payer: MEDICAID | Admitting: Internal Medicine

## 2023-04-14 ENCOUNTER — Encounter: Payer: Self-pay | Admitting: *Deleted

## 2023-04-14 ENCOUNTER — Telehealth: Payer: Self-pay | Admitting: Gastroenterology

## 2023-04-14 ENCOUNTER — Telehealth: Payer: Self-pay | Admitting: *Deleted

## 2023-04-14 ENCOUNTER — Ambulatory Visit (INDEPENDENT_AMBULATORY_CARE_PROVIDER_SITE_OTHER): Payer: MEDICAID | Admitting: Gastroenterology

## 2023-04-14 ENCOUNTER — Encounter: Payer: Self-pay | Admitting: Gastroenterology

## 2023-04-14 VITALS — BP 136/86 | HR 86 | Temp 98.6°F | Ht 69.0 in | Wt 235.0 lb

## 2023-04-14 DIAGNOSIS — K219 Gastro-esophageal reflux disease without esophagitis: Secondary | ICD-10-CM

## 2023-04-14 DIAGNOSIS — R1310 Dysphagia, unspecified: Secondary | ICD-10-CM | POA: Diagnosis not present

## 2023-04-14 DIAGNOSIS — K5900 Constipation, unspecified: Secondary | ICD-10-CM

## 2023-04-14 DIAGNOSIS — B182 Chronic viral hepatitis C: Secondary | ICD-10-CM

## 2023-04-14 DIAGNOSIS — Z8619 Personal history of other infectious and parasitic diseases: Secondary | ICD-10-CM

## 2023-04-14 DIAGNOSIS — K5903 Drug induced constipation: Secondary | ICD-10-CM

## 2023-04-14 NOTE — Progress Notes (Addendum)
 GI Office Note    Referring Provider: Shelby Dubin, FNP Primary Care Physician:  Shelby Dubin, FNP Primary Gastroenterologist: Hennie Duos. Marletta Lor, DO  Date:  04/14/2023  ID:  Levi Campbell, DOB 11-07-1960, MRN [id number]   Chief Complaint   Chief Complaint  Patient presents with   Follow-up    Follow up on GERD and EGD   History of Present Illness  Levi Campbell is a 63 y.o. male with a history of COPD, chronic pain, HTN, and GERD presenting today for follow-up of reflux.  Per review of referral paperwork he has admitted to marijuana use.  Has used pain medications off the street for pain management since he did not have a chronic pain provider.  Advised to see GI in regards to refractory reflux given he is to start antiviral's for hep C treatment per these notes he is awaiting pharmacy pre approval from pharmacy to see which medication is covered.  He was also referred to pulmonology and advised to start an albuterol inhaler as needed and Symbicort daily.  Given pneumonia vaccination.   Colonoscopy 07/09/2022 by Dr. Marcha Solders: -Hemorrhoids and perianal exam -Sigmoid and descending diverticulosis -Redundant colon -6 small polyps removed from the colon -1 diminutive polyp in the distal rectum removed\ -Non-bleeding internal hemorrhoids -Advised Metamucil 1 teaspoon twice daily -Pathology revealed hyperplastic polyps -He was recommended to have a 10-year repeat  Last office visit 01/08/23.  Admitted to using Zantac or ranitidine until it was taken off the market and this had worked very well for him.  He had been taking over-the-counter omeprazole for 2 months.  Has taken pantoprazole before in the past but stated it was not his prescription.  Does admit to lots of fried foods but denies spicy foods or NSAIDs.  Has taken oral diclofenac in the past.  Denied nausea, vomiting, or dysphagia.  Does feel an occasional throat tightening up at times with swallowing.  Reportedly sober for a  year from alcohol and about 3 years from other drugs although does admit to marijuana use.  Noted some intermittent constipation with Bristol 1-5 stools, has a bowel movement about every other day, on chronic opioids.  Advised him to proceed with Epclusa therapy prescribed by his PCP.  Advised famotidine 40 mg once daily and can increase to twice daily if needed.  Advised to perform H. pylori breath test in 2 weeks.  Start MiraLAX once daily for mild constipation.  Breath test has not been performed.  Today:  Epclusa - finished 2/28 (no side effects).   GERD - does not really have heartburn anymore since he has seen pulmonology and changes his BP med from lisinopril to losartan. Every now and then he gets a bout and he will take a dose of omeprazole and that knocks it out. Not taking everyday now. Has some occasional issues with swallowing and depends on how dry he gets. He states mouth gets dry overnight due to snoring. Denies lack of appetite, odynophagia, weight loss, N/V.   Constipation - Depending on what he eats and his medication he goes everyday (especially after coffee). Has not taken miralax. Denies straining. No blood in his stool or black stool. Denies abdominal pain.   Does not feel short of breath right now and no chest pain.    PCP - Dr. Rosezena Sensor [facility])  [facility] Readings from Last 3 Encounters:  04/14/23 235 lb (106.6 kg)  02/11/23 234 lb (106.1 kg)  01/08/23 235 lb 12.8 oz (  107 kg)    Current Outpatient Medications  Medication Sig Dispense Refill   BELBUCA 450 MCG FILM SMARTSIG:1 Strip(s) By Mouth Every 12 Hours     cetirizine (ZYRTEC) 10 MG tablet TAKE 1 TABLET BY MOUTH DAILY for 30     cycloSPORINE (RESTASIS) 0.05 % ophthalmic emulsion 1 drop 2 (two) times daily.     naloxone (NARCAN) nasal spray 4 mg/0.1 mL Place 1 spray into the nose once.     olmesartan (BENICAR) 20 MG tablet Take 1 tablet (20 mg total) by mouth daily. 30 tablet 11   SYMBICORT 80-4.5 MCG/ACT  inhaler Inhale 2 puffs into the lungs daily.     VENTOLIN HFA 108 (90 Base) MCG/ACT inhaler Inhale 1 puff into the lungs every 4 (four) hours.     omeprazole (PRILOSEC) 20 MG capsule Take 20 mg by mouth. TAKES EVERY 3RD DAY (Patient not taking: Reported on 04/14/2023)     OVER THE COUNTER MEDICATION Instaflex joint support (Patient not taking: Reported on 04/14/2023)     Sofosbuvir-Velpatasvir (EPCLUSA) 400-100 MG TABS 1 tablet Orally Once a day for 90 days (Patient not taking: Reported on 04/14/2023)     No current facility-administered medications for this visit.    Past Medical History:  Diagnosis Date   Alcohol abuse    quit 1 year ago   COPD (chronic obstructive pulmonary disease) (HCC)    Hepatitis C infection    Hypertension    Opiate addiction (HCC)     Past Surgical History:  Procedure Laterality Date   CHOLECYSTECTOMY     KNEE SURGERY Left     Family History  Problem Relation Age of Onset   Stroke Mother    Cancer - Ovarian Mother    Colitis Mother    Heart attack Father    Venous thrombosis Sister    Heart failure Sister    Other Sister        gastric bypass x2   Alcohol abuse Sister    Kidney cancer Brother    Bladder Cancer Brother     Allergies as of 04/14/2023 - Review Complete 04/14/2023  Allergen Reaction Noted   Tape Dermatitis, Itching, and Rash 06/08/2022   Codeine Hives 11/10/2012    Social History   Socioeconomic History   Marital status: Widowed    Spouse name: Not on file   Number of children: Not on file   Years of education: Not on file   Highest education level: Not on file  Occupational History   Not on file  Tobacco Use   Smoking status: Every Day    Current packs/day: 1.50    Types: Cigarettes   Smokeless tobacco: Not on file  Vaping Use   Vaping status: Former  Substance and Sexual Activity   Alcohol use: Not Currently    Alcohol/week: 2.0 - 4.0 standard drinks of alcohol    Types: 2 - 4 Cans of beer per week    Comment:  daily   Drug use: Yes    Types: Marijuana, Methamphetamines, Cocaine    Comment: occassional   Sexual activity: Yes    Birth control/protection: None  Other Topics Concern   Not on file  Social History Narrative   Not on file   Social Drivers of Health   Financial Resource Strain: Not on file  Food Insecurity: Not on file  Transportation Needs: Not on file  Physical Activity: Not on file  Stress: Not on file  Social Connections: Unknown (09/05/2021)  Received from [facility]   Social Network    Social Network: Not on file     Review of Systems   Gen: Denies fever, chills, anorexia. Denies fatigue, weakness, weight loss.  CV: Denies chest pain, palpitations, syncope, peripheral edema, and claudication. Resp: + snoring, intermittent dyspnea with exertion. Denies dyspnea at rest, cough, wheezing, coughing up blood, and pleurisy. GI: See HPI Derm: Denies rash, itching, dry skin Psych: Denies depression, anxiety, memory loss, confusion. No homicidal or suicidal ideation.  Heme: Denies bruising, bleeding, and enlarged lymph nodes.  Physical Exam   BP 136/86   Pulse 86   Temp 98.6 F (37 C)   Ht 5\' 9"  (1.753 m)   Wt 235 lb (106.6 kg)   BMI 34.70 kg/m   General:   Alert and oriented. No distress noted. Pleasant and cooperative.  Head:  Normocephalic and atraumatic. Eyes:  Conjuctiva clear without scleral icterus. Lungs:  Clear to auscultation bilaterally overall, no wheezing but some tightness on inspiration.  Heart:  S1, S2 present without murmurs appreciated.  Abdomen:  Soft, non-tender and non-distended but rounded. No rebound or guarding. No HSM or masses noted. Rectal: deferred Msk:  Symmetrical without gross deformities. Normal posture. Neurologic:  Alert and  oriented x4 Psych:  Alert and cooperative. Normal mood and affect.  Assessment  Levi Campbell is a 63 y.o. male with a history of COPD, chronic pain, HTN, and GERD presenting today for follow-up of  reflux.   GERD, dysphagia: Reports that since seeing pulmonology and change in his blood pressure medicine that he has not really had much reflux episodes.  He has only used omeprazole a handful of times, not currently using any famotidine. Still having some occasional dysphagia but could also be related to dry mouth from snoring. He would still like to proceed with EGD given his symptoms and prior reflux as well as hepatitis C (states Dr. Mauri Reading has wanted him to have this as well). Admits to ongoing tobacco use (2 ppd).   Constipation: Most likely opioid induced.  Previously reported Bristol 1-5 stools with bowel movement about every other day with some intermittent straining.  Currently since his last visit, he denies any issues with straining, BRBPR, or abdominal pain.  He reports a bowel movement essentially daily and has not been taking any MiraLAX as previously recommended although I did recommend that if he started to have some issues he should start MiraLAX and ultimately if having worsening issues in the future, could consider Amitiza versus Movantik.  Hepatitis C: Completed Epclusa 2/28.  Will be due for eradication testing 5/28.  Denies any issues or side effects with medication.  PLAN   Proceed with upper endoscopy with propofol by Dr. Marletta Lor in near future: the risks, benefits, and alternatives have been discussed with the patient in detail. The patient states understanding and desires to proceed. ASA 3 Continue omeprazole 20 mg as needed GERD diet Recheck Hep C RNA after 5/28 If constipation develops start miralax.  Follow up TBD after EGD.    Addendum: Post visit, received some documentation from PCP office with some notes from his last visit on 3//25 noting some previous noninvasive testing with FibroSure with score of 0.72, stage F3-F4 consistent with advanced fibrosis.  Reports a liver ultrasound in September 2024 with mild nodular contour of the liver, possible cirrhosis,  planning to repeat in March.  Given concern for possible cirrhosis he was recommended to have every 10-month HCC screening and reports that he  wants him to see GI for variceal screening.  Will plan to request prior labs as well as prior ultrasound and ask about repeat ultrasound and when this will be scheduled.  If no repeat labs performed recently, will repeat labs as well as obtain AFP to assess MELD score if repeat ultrasound confirms cirrhosis.   Brooke Bonito, MSN, FNP-BC, AGACNP-BC [facility]

## 2023-04-14 NOTE — Patient Instructions (Addendum)
 Follow a GERD diet:  Avoid fried, fatty, greasy, spicy, citrus foods. Avoid caffeine and carbonated beverages. Avoid chocolate. Try eating 4-6 small meals a day rather than 3 large meals. Do not eat within 3 hours of laying down. Prop head of bed up on wood or bricks to create a 6 inch incline.  We are scheduling you for an upper endoscopy in the near future with Dr. Marletta Lor. You can continue to use omeprazole as needed.   If Dr. Vassie Moselle does not already have labs ordered for you, I can also order your repeat hepatitis C labs that will need to be performed after May 28 to ensure that treatment was successful.  If you start developing worsening constipation then I would recommend miralax 1 capful once per day. \  Follow up as needed.   It was a pleasure to see you today. I want to create trusting relationships with patients. If you receive a survey regarding your visit,  I greatly appreciate you taking time to fill this out on paper or through your MyChart. I value your feedback.  Brooke Bonito, MSN, FNP-BC, AGACNP-BC [facility]

## 2023-04-14 NOTE — Telephone Encounter (Signed)
 Pt called and needed to reschedule his procedure on 05/07/23 due to wife having an appointment. He has been rescheduled until 05/12/23 at 9:00 am. Updated instructions mailed

## 2023-04-14 NOTE — Telephone Encounter (Signed)
 Records have been requested.

## 2023-04-14 NOTE — Telephone Encounter (Signed)
 Ann/Susan -please try to obtain patient's labs, ultrasound, and last visit note from PCP office [facility] Pedro).  Was able to review some prior paperwork that has concerns about cirrhosis and need these records to determine if he needs to continue to follow with [name].  Mandy -please place him on recall for an office visit in about  3 months, which should be after his procedure.   Brooke Bonito, MSN, APRN, FNP-BC, AGACNP-BC [facility]

## 2023-04-22 ENCOUNTER — Telehealth: Payer: Self-pay | Admitting: Internal Medicine

## 2023-04-22 NOTE — Telephone Encounter (Signed)
 Spoke with patient regarding the Thursday 05/29/23 10:00 am PFT appointment at [facility] time is 9:45 am 1st floor registration desk---follow up with Dr. Sherene Sires at 11:15 am -will mail information to patient and he voiced his understanding

## 2023-05-05 ENCOUNTER — Other Ambulatory Visit (HOSPITAL_COMMUNITY): Payer: MEDICAID

## 2023-05-07 NOTE — Patient Instructions (Signed)
 Levi Campbell  05/07/2023     @PREFPERIOPPHARMACY @   Your procedure is scheduled on  05/12/2023.   Report to [name] at  0700  A.M.   Call this number if you have problems the morning of surgery:  [phone]  If you experience any cold or flu symptoms such as cough, fever, chills, shortness of breath, etc. between now and your scheduled surgery, please notify us at the above number.   Remember:  Follow the diet instructions given to you by the office.    You may drink clear liquids until  0500 am on 05/12/2023.    Clear liquids allowed are:                    Water, Juice (No red color; non-citric and without pulp; diabetics please choose diet or no sugar options), Carbonated beverages (diabetics please choose diet or no sugar options), Clear Tea (No creamer, milk, or cream, including half & half and powdered creamer), Black Coffee Only (No creamer, milk or cream, including half & half and powdered creamer), and Clear Sports drink (No red color; diabetics please choose diet or no sugar options)    Take these medicines the morning of surgery with A SIP OF WATER                                                     None.      Do not wear jewelry, make-up or nail polish, including gel polish,  artificial nails, or any other type of covering on natural nails (fingers and  toes).  Do not wear lotions, powders, or perfumes, or deodorant.  Do not shave 48 hours prior to surgery.  Men may shave face and neck.  Do not bring valuables to the hospital.  [facility] is not responsible for any belongings or valuables.  Contacts, dentures or bridgework may not be worn into surgery.  Leave your suitcase in the car.  After surgery it may be brought to your room.  For patients admitted to the hospital, discharge time will be determined by your treatment team.  Patients discharged the day of surgery will not be allowed to drive home and must have someone with them for 24 hours.     Special instructions:  DO NOT smoke tobacco or vape for 24 hours before your procedure.   Please read over the following fact sheets that you were given. Anesthesia Post-op Instructions and Care and Recovery After Surgery      Upper Endoscopy, Adult, Care After After the procedure, it is common to have a sore throat. It is also common to have: Mild stomach pain or discomfort. Bloating. Nausea. Follow these instructions at home: The instructions below may help you care for yourself at home. Your health care provider may give you more instructions. If you have questions, ask your health care provider. If you were given a sedative during the procedure, it can affect you for several hours. Do not drive or operate machinery until your health care provider says that it is safe. If you will be going home right after the procedure, plan to have a responsible adult: Take you home from the hospital or clinic. You will not be allowed to drive. Care for you for the time you are  told. Follow instructions from your health care provider about what you may eat and drink. Return to your normal activities as told by your health care provider. Ask your health care provider what activities are safe for you. Take over-the-counter and prescription medicines only as told by your health care provider. Contact a health care provider if you: Have a sore throat that lasts longer than one day. Have trouble swallowing. Have a fever. Get help right away if you: Vomit blood or your vomit looks like coffee grounds. Have bloody, black, or tarry stools. Have a very bad sore throat or you cannot swallow. Have difficulty breathing or very bad pain in your chest or abdomen. These symptoms may be an emergency. Get help right away. Call 911. Do not wait to see if the symptoms will go away. Do not drive yourself to the hospital. Summary After the procedure, it is common to have a sore throat, mild stomach  discomfort, bloating, and nausea. If you were given a sedative during the procedure, it can affect you for several hours. Do not drive until your health care provider says that it is safe. Follow instructions from your health care provider about what you may eat and drink. Return to your normal activities as told by your health care provider. This information is not intended to replace advice given to you by your health care provider. Make sure you discuss any questions you have with your health care provider. Document Revised: 05/02/2021 Document Reviewed: 05/02/2021 Elsevier Patient Education  2024 [employer]vier Inc.Esophageal Dilatation Esophageal dilatation, or dilation, is done to stretch a blocked or narrowed part of your esophagus. The esophagus is the part of your body that moves food from your mouth to your stomach. You may need to have it stretched if: You have a lot of scar tissue and it makes it hard or painful to swallow. You have cancer of the esophagus. There's a problem with how food moves through your esophagus. In some cases, you may need to have this procedure done more than once. Tell a health care provider about: Any allergies you have. All medicines you're taking, including vitamins, herbs, eye drops, creams, and over-the-counter medicines. Any problems you or family members have had with anesthesia. Any bleeding problems you have. Any surgeries you've had. Any medical conditions you have. Whether you're pregnant or may be pregnant. What are the risks? Your health care provider will talk with you about risks. These may include: Bleeding. A hole or tear in your esophagus. What happens before the procedure? When to stop eating and drinking Follow instructions from your provider about what you may eat and drink. These may include: 8 hours before your procedure Stop eating most foods. Do not eat meat, fried foods, or fatty foods. Eat only light foods, such as toast or  crackers. All liquids are okay except energy drinks and alcohol. 6 hours before your procedure Stop eating. Drink only clear liquids, such as water, clear fruit juice, black coffee, plain tea, and sports drinks. Do not drink energy drinks or alcohol. 2 hours before your procedure Stop drinking all liquids. You may be allowed to take medicines with small sips of water. If you don't follow your provider's instructions, your procedure may be delayed or canceled. Medicines Ask your provider about: Changing or stopping your regular medicines. These include any diabetes medicines or blood thinners you take. Taking medicines such as aspirin and ibuprofen. These medicines can thin your blood. Do not take them unless your provider  tells you to. Taking over-the-counter medicines, vitamins, herbs, and supplements. General instructions If you'll be going home right after the procedure, plan to have a responsible adult: Take you home from the hospital or clinic. You won't be allowed to drive. Care for you for the time you're told. What happens during the procedure? You may be given: A sedative. This helps you relax. Anesthesia. This keeps you from feeling pain. It will numb certain areas of your body. The stretching may be done with: Simple dilators. These are tools put in your esophagus to stretch it. Guide wires. These wires are put in using a tube called an endoscope. A dilator is put over the wires to stretch your esophagus. Then the wires are taken out. A balloon. The balloon is on the end of a tube. It's inflated to stretch your esophagus. The procedure may vary among providers and hospitals. What can I expect after the procedure? Your blood pressure, heart rate, breathing rate, and blood oxygen level will be monitored until you leave the hospital or clinic. Your throat may feel sore and numb. This will get better over time. You won't be allowed to eat or drink until your throat is no longer  numb. You may be able to go home when you can: Drink. Pee. Sit on the edge of the bed without nausea or dizziness. Follow these instructions at home: Activity If you were given a sedative during the procedure, it can affect you for several hours. Do not drive or operate machinery until your provider says it's safe. Return to your normal activities as told by your provider. Ask your provider what activities are safe for you. General instructions Take over-the-counter and prescription medicines only as told by your provider. Follow instructions from your provider about what you may eat and drink. Do not use any products that contain nicotine or tobacco. These products include cigarettes, chewing tobacco, and vaping devices, such as e-cigarettes. If you need help quitting, ask your provider. Keep all follow-up visits. Your provider will make sure the procedure worked. Where to find more information American Society for Gastrointestinal Endoscopy (ASGE): asge.org Contact a health care provider if: You have trouble swallowing. You have a fever. Your pain doesn't get better with medicine. Get help right away if: You have chest pain. You have trouble breathing. You vomit blood. Your poop is: Black. Tarry. Bloody. These symptoms may be an emergency. Get help right away. Call 911. Do not wait to see if the symptoms will go away. Do not drive yourself to the hospital. This information is not intended to replace advice given to you by your health care provider. Make sure you discuss any questions you have with your health care provider. Document Revised: 04/19/2022 Document Reviewed: 04/19/2022 Elsevier Patient Education  2024 Elsevier Inc.General Anesthesia, Adult, Care After The following information offers guidance on how to care for yourself after your procedure. Your health care provider may also give you more specific instructions. If you have problems or questions, contact your health  care provider. What can I expect after the procedure? After the procedure, it is common for people to: Have pain or discomfort at the IV site. Have nausea or vomiting. Have a sore throat or hoarseness. Have trouble concentrating. Feel cold or chills. Feel weak, sleepy, or tired (fatigue). Have soreness and body aches. These can affect parts of the body that were not involved in surgery. Follow these instructions at home: For the time period you were told by your health  care provider:  Rest. Do not participate in activities where you could fall or become injured. Do not drive or use machinery. Do not drink alcohol. Do not take sleeping pills or medicines that cause drowsiness. Do not make important decisions or sign legal documents. Do not take care of children on your own. General instructions Drink enough fluid to keep your urine pale yellow. If you have sleep apnea, surgery and certain medicines can increase your risk for breathing problems. Follow instructions from your health care provider about wearing your sleep device: Anytime you are sleeping, including during daytime naps. While taking prescription pain medicines, sleeping medicines, or medicines that make you drowsy. Return to your normal activities as told by your health care provider. Ask your health care provider what activities are safe for you. Take over-the-counter and prescription medicines only as told by your health care provider. Do not use any products that contain nicotine or tobacco. These products include cigarettes, chewing tobacco, and vaping devices, such as e-cigarettes. These can delay incision healing after surgery. If you need help quitting, ask your health care provider. Contact a health care provider if: You have nausea or vomiting that does not get better with medicine. You vomit every time you eat or drink. You have pain that does not get better with medicine. You cannot urinate or have bloody  urine. You develop a skin rash. You have a fever. Get help right away if: You have trouble breathing. You have chest pain. You vomit blood. These symptoms may be an emergency. Get help right away. Call 911. Do not wait to see if the symptoms will go away. Do not drive yourself to the hospital. Summary After the procedure, it is common to have a sore throat, hoarseness, nausea, vomiting, or to feel weak, sleepy, or fatigue. For the time period you were told by your health care provider, do not drive or use machinery. Get help right away if you have difficulty breathing, have chest pain, or vomit blood. These symptoms may be an emergency. This information is not intended to replace advice given to you by your health care provider. Make sure you discuss any questions you have with your health care provider. Document Revised: 04/20/2021 Document Reviewed: 04/20/2021 Elsevier Patient Education  2024 [employer].

## 2023-05-08 ENCOUNTER — Encounter (HOSPITAL_COMMUNITY): Payer: Self-pay

## 2023-05-08 ENCOUNTER — Encounter (HOSPITAL_COMMUNITY)
Admission: RE | Admit: 2023-05-08 | Discharge: 2023-05-08 | Disposition: A | Discharge status: Home / Self Care | Payer: MEDICAID | Source: Ambulatory Visit | Attending: Internal Medicine | Admitting: Internal Medicine

## 2023-05-08 VITALS — BP 155/88 | HR 83 | Temp 98.0°F | Resp 18 | Ht 69.0 in | Wt 235.0 lb

## 2023-05-08 DIAGNOSIS — F17210 Nicotine dependence, cigarettes, uncomplicated: Secondary | ICD-10-CM | POA: Diagnosis not present

## 2023-05-08 DIAGNOSIS — Z01818 Encounter for other preprocedural examination: Secondary | ICD-10-CM | POA: Diagnosis present

## 2023-05-08 DIAGNOSIS — F1011 Alcohol abuse, in remission: Secondary | ICD-10-CM | POA: Insufficient documentation

## 2023-05-08 DIAGNOSIS — I10 Essential (primary) hypertension: Secondary | ICD-10-CM | POA: Diagnosis not present

## 2023-05-08 LAB — RAPID URINE DRUG SCREEN, HOSP PERFORMED
Amphetamines: NOT DETECTED
Barbiturates: NOT DETECTED
Benzodiazepines: NOT DETECTED
Cocaine: NOT DETECTED
Opiates: NOT DETECTED
Tetrahydrocannabinol: POSITIVE — AB

## 2023-05-08 LAB — COMPREHENSIVE METABOLIC PANEL WITH GFR
ALT: 11 U/L (ref 0–44)
AST: 18 U/L (ref 15–41)
Albumin: 3.8 g/dL (ref 3.5–5.0)
Alkaline Phosphatase: 69 U/L (ref 38–126)
Anion gap: 11 (ref 5–15)
BUN: 10 mg/dL (ref 6–20)
CO2: 22 mmol/L (ref 22–32)
Calcium: 9.1 mg/dL (ref 8.9–10.3)
Chloride: 99 mmol/L (ref 98–111)
Creatinine, Ser: 0.8 mg/dL (ref 0.61–1.24)
GFR, Estimated: >60 mL/min (ref >60)
Glucose, Bld: 96 mg/dL (ref 70–99)
Potassium: 3.6 mmol/L (ref 3.5–5.1)
Sodium: 132 mmol/L — ABNORMAL LOW (ref 135–145)
Total Bilirubin: 0.9 mg/dL (ref 0.0–1.2)
Total Protein: 7.7 g/dL (ref 6.5–8.1)

## 2023-05-08 NOTE — Progress Notes (Signed)
   05/08/23 1051  OBSTRUCTIVE SLEEP APNEA  Have you ever been diagnosed with sleep apnea through a sleep study? No  Do you snore loudly (loud enough to be heard through closed doors)?  0  Do you often feel tired, fatigued, or sleepy during the daytime (such as falling asleep during driving or talking to someone)? 1  Has anyone observed you stop breathing during your sleep? 1  Do you have, or are you being treated for high blood pressure? 1  BMI more than 35 kg/m2? 0  Age > 50 (1-yes) 1  Neck circumference greater than:Male 16 inches or larger, Male 17inches or larger? 0  Male Gender (Yes=1) 1  Obstructive Sleep Apnea Score 5     05/08/23 1051  OBSTRUCTIVE SLEEP APNEA  Have you ever been diagnosed with sleep apnea through a sleep study? No  Do you snore loudly (loud enough to be heard through closed doors)?  0  Do you often feel tired, fatigued, or sleepy during the daytime (such as falling asleep during driving or talking to someone)? 1  Has anyone observed you stop breathing during your sleep? 1  Do you have, or are you being treated for high blood pressure? 1  BMI more than 35 kg/m2? 0  Age > 50 (1-yes) 1  Neck circumference greater than:Male 16 inches or larger, Male 17inches or larger? 0  Male Gender (Yes=1) 1  Obstructive Sleep Apnea Score 5

## 2023-05-12 ENCOUNTER — Ambulatory Visit (HOSPITAL_COMMUNITY): Payer: MEDICAID | Admitting: Certified Registered"

## 2023-05-12 ENCOUNTER — Telehealth: Payer: Self-pay | Admitting: *Deleted

## 2023-05-12 ENCOUNTER — Encounter (HOSPITAL_COMMUNITY)
Admission: RE | Disposition: A | Discharge status: Home / Self Care | Payer: Self-pay | Source: Home / Self Care | Attending: Internal Medicine

## 2023-05-12 ENCOUNTER — Ambulatory Visit (HOSPITAL_COMMUNITY)
Admission: RE | Admit: 2023-05-12 | Discharge: 2023-05-12 | Disposition: A | Discharge status: Home / Self Care | Payer: MEDICAID | Source: Home / Self Care | Attending: Internal Medicine | Admitting: Internal Medicine

## 2023-05-12 SURGERY — EGD (ESOPHAGOGASTRODUODENOSCOPY)
Anesthesia: General

## 2023-05-12 MED ORDER — EPHEDRINE 5 MG/ML INJ
INTRAVENOUS | Status: AC
  Filled 2023-05-12: qty 5

## 2023-05-12 NOTE — OR Nursing (Signed)
 Patient upset due to having to wait. Arrived to [facility] @ [phone]. Instructed to arrive at 0700. Procedure was scheduled to start at 0900. Protocol is to have patient arrive 2 hours before procedure. Patient left AMA angry at 0805. We were in the process of getting patient to preop holding area but he left cussing. Turned his labels in at main entrance. Told registration person that everybody could kiss his a__.

## 2023-05-12 NOTE — Telephone Encounter (Signed)
 Pt left endo prior to procedure being done. Did not want to wait.

## 2023-05-12 NOTE — Anesthesia Preprocedure Evaluation (Signed)
 Anesthesia Evaluation  Patient identified by MRN, date of birth, ID band Patient awake    Reviewed: Allergy & Precautions, H&P , NPO status , Patient's Chart, lab work & pertinent test results, reviewed documented beta blocker date and time   Airway Mallampati: II  TM Distance: >3 FB Neck ROM: full    Dental   Pulmonary COPD, Current Smoker   Pulmonary exam normal breath sounds clear to auscultation       Cardiovascular Exercise Tolerance: Good hypertension, Normal cardiovascular exam Rhythm:regular Rate:Normal     Neuro/Psych  PSYCHIATRIC DISORDERS      negative neurological ROS     GI/Hepatic ,GERD  ,,(+)     substance abuse  alcohol use, Hepatitis -, C  Endo/Other  negative endocrine ROS    Renal/GU negative Renal ROS  negative genitourinary   Musculoskeletal  (+)  narcotic dependent  Abdominal   Peds  Hematology negative hematology ROS (+)   Anesthesia Other Findings   Reproductive/Obstetrics negative OB ROS                             Anesthesia Physical Anesthesia Plan  ASA: 3  Anesthesia Plan: General   Post-op Pain Management: Minimal or no pain anticipated   Induction:   PONV Risk Score and Plan: Propofol infusion  Airway Management Planned: Natural Airway and Nasal Cannula  Additional Equipment: None  Intra-op Plan:   Post-operative Plan:   Informed Consent: I have reviewed the patients History and Physical, chart, labs and discussed the procedure including the risks, benefits and alternatives for the proposed anesthesia with the patient or authorized representative who has indicated his/her understanding and acceptance.     Dental Advisory Given  Plan Discussed with: CRNA  Anesthesia Plan Comments:         Anesthesia Quick Evaluation

## 2023-05-15 NOTE — H&P (Signed)
 Patient left before procedure.

## 2023-05-29 ENCOUNTER — Ambulatory Visit (HOSPITAL_COMMUNITY): Payer: MEDICAID

## 2023-05-29 ENCOUNTER — Ambulatory Visit: Payer: MEDICAID | Admitting: Internal Medicine

## 2023-06-11 ENCOUNTER — Encounter: Payer: Self-pay | Admitting: Gastroenterology

## 2023-06-27 DIAGNOSIS — Z8619 Personal history of other infectious and parasitic diseases: Secondary | ICD-10-CM | POA: Insufficient documentation

## 2023-06-29 NOTE — Progress Notes (Unsigned)
 Levi Campbell, male    DOB: January 26, 1961    MRN: [id number]   Brief patient profile:  89 yowm active smoker/MM  referred to pulmonary clinic in [place]  02/11/2023 by Julian Obey NP   for doe onset in 1990s  using prn saba  initially then started on symbicort which even on 80 strength bothers throat along with overt HB      History of Present Illness  02/11/2023  Pulmonary/ 1st office eval/ [facility] / [place] [facility] maint on symbicot 80 2bid / ACEi  Chief Complaint  Patient presents with   Establish Care   Shortness of Breath  Dyspnea:  more limited by legs / hips than breathing at present   Cough: variable with temp but really 24/7 yellow and occ blood streaks  Sleep: bed is flat, 3 pillows under head 3/7 nights coughing  SABA use: none today  02: none  LDSCT:ordered  Bad HB best rx omeprazole 20  did no think  pepcid  rec by gi worked as well  Rec Stop lisinopril and replace with olmesartan  20 mg one daily  My office will be contacting you by phone for referral to lung cancer sreeening  [phone]   and PFTs For cough/ congestion > mucinex dm 1200 mg every 12 hours as needed  Work on inhaler technique:  Your can use the Symbicort 80  up to 2 puffs every 12 hours   Cxr nl     The key is to stop smoking completely before smoking completely stops you!  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 Also limited by back and hip pain  Cough: better off acei but still very raspy assoc with sensation of pnds Sleeping: 30 degrees recliner   s resp cc  SABA use: 2x weekly  02: none   Lung cancer screening: referred 07/01/2023   No obvious day to day or daytime variability or assoc excess/ purulent sputum or mucus plugs or hemoptysis or  cp or chest tightness, subjective wheeze or overt   hb symptoms.    Also denies any obvious fluctuation of symptoms with weather or environmental changes or other aggravating or alleviating factors except as outlined above   No unusual exposure hx or h/o childhood pna/ asthma or knowledge of premature birth.  Current Allergies, Complete Past Medical History, Past Surgical History, Family History, and Social History were reviewed in [employer] record.  ROS  The following are not active complaints unless bolded Hoarseness, sore throat= globus, dysphagia, dental problems, itching, sneezing,  nasal congestion or discharge of excess mucus or purulent secretions, ear ache,   fever, chills, sweats, unintended wt loss or wt gain, classically pleuritic or exertional cp,  orthopnea pnd or arm/hand swelling  or leg swelling, presyncope, palpitations, abdominal pain, anorexia, nausea, vomiting, diarrhea  or change in bowel habits or change in bladder habits, change in stools or change in urine, dysuria, hematuria,  rash, arthralgias, visual complaints, headache, numbness, weakness or ataxia or problems with walking or coordination,  change in mood or  memory.        Current Meds  Medication Sig   cetirizine (ZYRTEC) 10 MG tablet  TAKE 1 TABLET BY MOUTH DAILY for 30   cycloSPORINE (RESTASIS) 0.05 % ophthalmic emulsion 1 drop 2 (two) times daily.   meloxicam (MOBIC) 15 MG tablet Take 15 mg by mouth daily.   naloxone (NARCAN) nasal spray 4 mg/0.1 mL Place 1 spray into the nose once.   olmesartan  (BENICAR ) 20 MG tablet Take 1 tablet (20 mg total) by mouth daily.   OVER THE COUNTER MEDICATION Instaflex joint support   oxyCODONE-acetaminophen (PERCOCET) 7.5-325 MG tablet Take 1 tablet by mouth every 6 (six) hours.   SYMBICORT 80-4.5 MCG/ACT inhaler Inhale 2 puffs into the lungs daily.   topiramate (TOPAMAX) 25 MG tablet Take 25 mg by mouth daily.   VENTOLIN HFA 108 (90 Base) MCG/ACT  inhaler Inhale 1 puff into the lungs every 4 (four) hours.          Past Medical History:  Diagnosis Date   Alcohol abuse    quit 1 year ago   COPD (chronic obstructive pulmonary disease) (HCC)    Hepatitis C infection    Hypertension    Opiate addiction (HCC)       Objective:    Wt Readings from Last 3 Encounters:  07/01/23 246 lb (111.6 kg)  05/08/23 235 lb 0.2 oz (106.6 kg)  04/14/23 235 lb (106.6 kg)     Vital signs reviewed  07/01/2023  - Note at rest 02 sats  92% on RA   General appearance:    amb hoarse wm nad / awkward gait attibutes to back pain   HEENT : Oropharynx  clear / full dentures   Nasal turbinates min turbinate edema/ watery discharge, no polyps or purulence    NECK :  without  apparent JVD/ palpable Nodes/TM    LUNGS: no acc muscle use,  Min barrel  contour chest wall with bilateral  slightly decreased bs s audible wheeze and  without cough on insp or exp maneuvers and min  Hyperresonant  to  percussion bilaterally    CV:  RRR  no s3 or murmur or increase in P2, and no edema   ABD:  obese soft and nontender with pos end  insp Hoover's  in the supine position.  No bruits or organomegaly appreciated   MS:   ext warm without deformities Or obvious joint restrictions  calf tenderness, cyanosis or clubbing     SKIN: warm and dry without lesions    NEURO:  alert, approp, nl sensorium with  no motor or cerebellar deficits apparent.           Assessment

## 2023-07-01 ENCOUNTER — Ambulatory Visit: Payer: MEDICAID | Admitting: Internal Medicine

## 2023-07-01 ENCOUNTER — Ambulatory Visit (HOSPITAL_COMMUNITY)
Admission: RE | Admit: 2023-07-01 | Discharge: 2023-07-01 | Disposition: A | Discharge status: Home / Self Care | Payer: MEDICAID | Source: Ambulatory Visit | Attending: Internal Medicine | Admitting: Internal Medicine

## 2023-07-01 ENCOUNTER — Encounter: Payer: Self-pay | Admitting: Internal Medicine

## 2023-07-01 VITALS — BP 166/81 | HR 72 | Ht 69.0 in | Wt 246.0 lb

## 2023-07-01 DIAGNOSIS — I10 Essential (primary) hypertension: Secondary | ICD-10-CM | POA: Diagnosis not present

## 2023-07-01 DIAGNOSIS — J449 Chronic obstructive pulmonary disease, unspecified: Secondary | ICD-10-CM | POA: Insufficient documentation

## 2023-07-01 DIAGNOSIS — R490 Dysphonia: Secondary | ICD-10-CM | POA: Diagnosis not present

## 2023-07-01 DIAGNOSIS — F17210 Nicotine dependence, cigarettes, uncomplicated: Secondary | ICD-10-CM

## 2023-07-01 LAB — PULMONARY FUNCTION TEST
DL/VA % pred: 72 %
DL/VA: 3.06 ml/min/mmHg/L
DLCO unc % pred: 72 %
DLCO unc: 19.25 ml/min/mmHg
FEF 25-75 Post: 0.9 L/s
FEF 25-75 Pre: 1.08 L/s
FEF2575-%Change-Post: -16 %
FEF2575-%Pred-Post: 31 %
FEF2575-%Pred-Pre: 38 %
FEV1-%Change-Post: -7 %
FEV1-%Pred-Post: 58 %
FEV1-%Pred-Pre: 63 %
FEV1-Post: 2.03 L
FEV1-Pre: 2.2 L
FEV1FVC-%Change-Post: -12 %
FEV1FVC-%Pred-Pre: 65 %
FEV6-%Change-Post: 1 %
FEV6-%Pred-Post: 98 %
FEV6-%Pred-Pre: 96 %
FEV6-Post: 4.27 L
FEV6-Pre: 4.2 L
FEV6FVC-%Change-Post: -5 %
FEV6FVC-%Pred-Post: 95 %
FEV6FVC-%Pred-Pre: 101 %
FVC-%Change-Post: 5 %
FVC-%Pred-Post: 103 %
FVC-%Pred-Pre: 97 %
FVC-Post: 4.71 L
FVC-Pre: 4.47 L
Post FEV1/FVC ratio: 43 %
Post FEV6/FVC ratio: 91 %
Pre FEV1/FVC ratio: 49 %
Pre FEV6/FVC Ratio: 96 %
RV % pred: 54 %
RV: 1.21 L
TLC % pred: 92 %
TLC: 6.29 L

## 2023-07-01 MED ORDER — ALBUTEROL SULFATE (2.5 MG/3ML) 0.083% IN NEBU
2.5000 mg | INHALATION_SOLUTION | Freq: Once | RESPIRATORY_TRACT | Status: AC
  Administered 2023-07-01: 2.5 mg via RESPIRATORY_TRACT

## 2023-07-01 NOTE — Assessment & Plan Note (Addendum)
 Ent referreal 07/01/2023 for hoarseness and flattened f/v loop    >>>  follow up visit in 3 months but call sooner if needed     Each maintenance medication was reviewed in detail including emphasizing most importantly the difference between maintenance and prns and under what circumstances the prns are to be triggered using an action plan format where appropriate.  Total time for H and P, chart review, counseling, reviewing hfa device(s) and generating customized AVS unique to this office visit / same day charting = 45 min  for multiple  refractory respiratory  symptoms of uncertain etiology

## 2023-07-01 NOTE — Assessment & Plan Note (Signed)
 Counseled re importance of smoking cessation but did not meet time criteria for separate billing    Low-dose CT lung cancer screening is recommended for patients who are 33-61 years of age with a 20+ pack-year history of smoking and who are currently smoking or quit <=15 years ago. No coughing up blood  No unintentional weight loss of > 15 pounds in the last 6 months - pt is eligible for scanning yearly until 25 y after quits > referred back

## 2023-07-01 NOTE — Assessment & Plan Note (Signed)
 D/c ACEi 02/11/2023  for cough   Although even in retrospect it may not be clear the ACEi contributed to the pt's symptoms,  Pt improved off them and adding them back at this point or in the future would risk confusion in interpretation of non-specific respiratory symptoms to which this patient is prone  ie  Better not to muddy the waters here.   >> continue benicar  20 mg daily / f/u with PCP may need additional meds to offset pain related hbp eg clonidine or betablockers - if chose the latter strongly prefer bisoprolol 5 or 10 mg as the most selective beta blockers.

## 2023-07-01 NOTE — Assessment & Plan Note (Signed)
 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

## 2023-07-01 NOTE — Patient Instructions (Addendum)
 My office will be contacting you by phone for referral to lung cancer screening   [phone]) and ENT evaluation for your hoarseness and nasal congestion  - if you don't hear back from my office within one week,  please call us  back or notify us  thru MyChart and we'll address it right away.    Handicap sticker today   The key is to stop smoking completely before smoking completely stops you - it's not too late!  No change in medications   Please schedule a follow up visit in 3 months but call sooner if needed

## 2023-07-05 ENCOUNTER — Ambulatory Visit: Payer: Self-pay | Admitting: Internal Medicine

## 2023-07-08 ENCOUNTER — Encounter (INDEPENDENT_AMBULATORY_CARE_PROVIDER_SITE_OTHER): Payer: Self-pay | Admitting: Otolaryngology

## 2023-08-16 IMAGING — DX DG FINGER INDEX 2+V*R*
3 series · 3 of 3 positions shown · non-contrast
Comparison: None.

CLINICAL DATA: Crush injury.  Infection.

EXAM:
RIGHT INDEX FINGER 2+V

[finger ap]
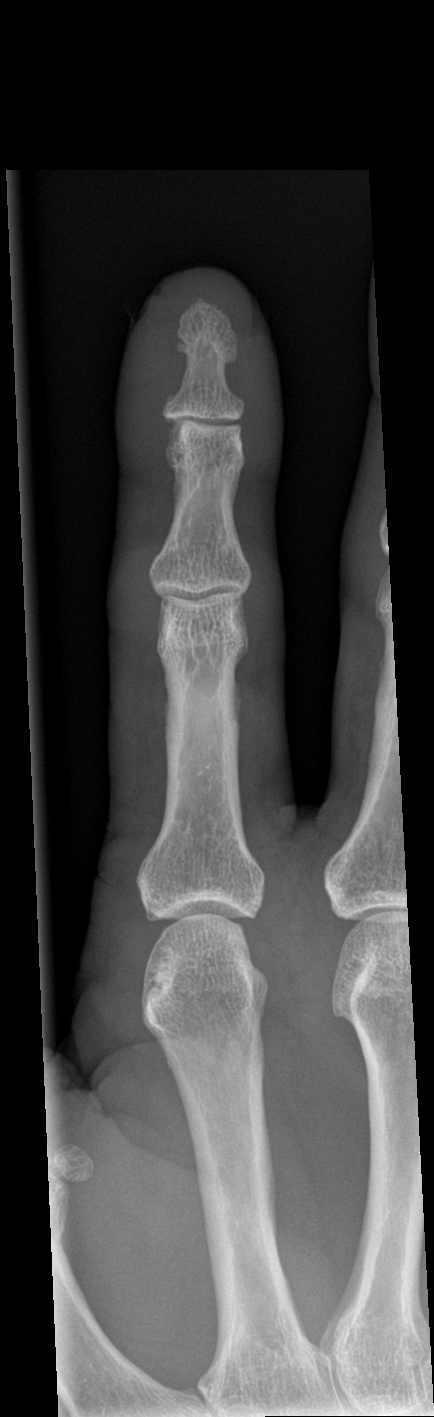

[finger obl]
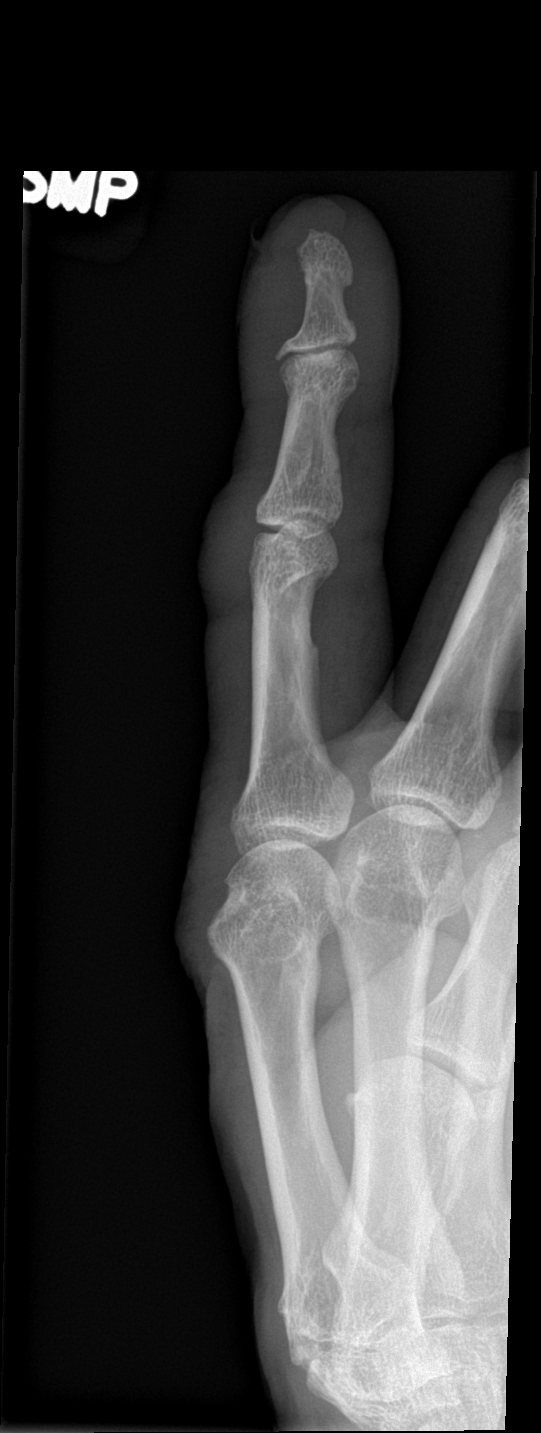

[finger lat]
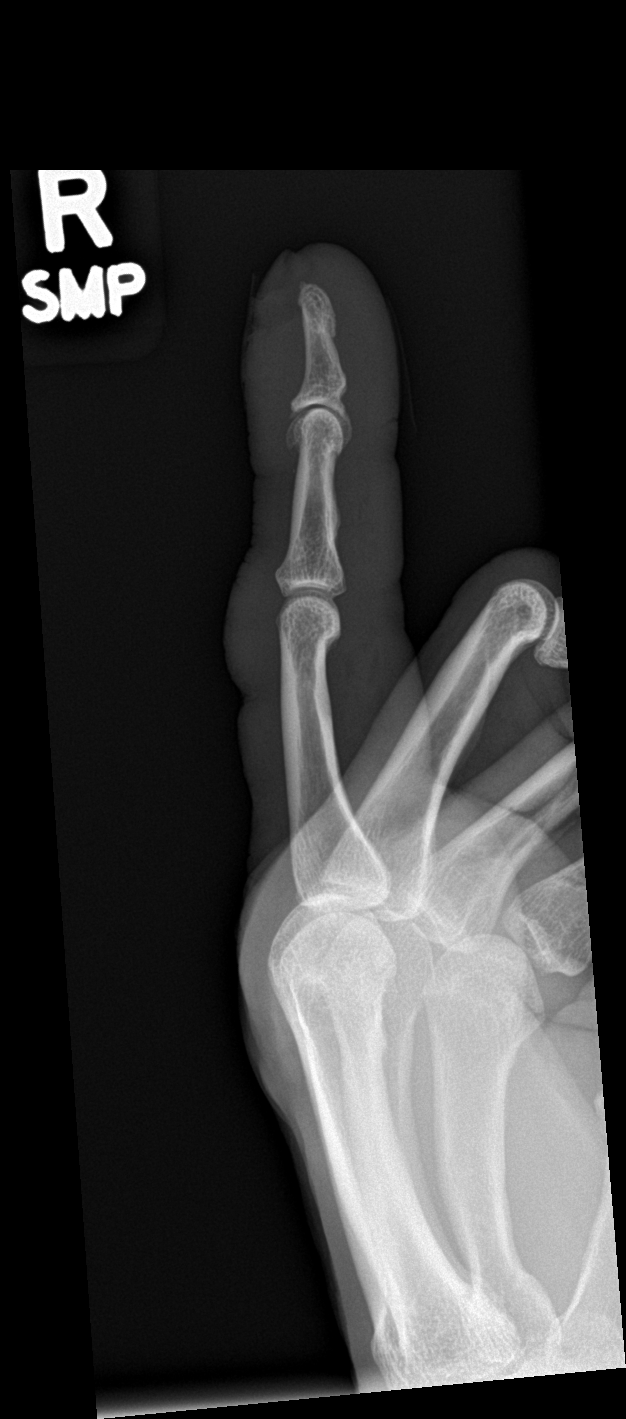

[3 of 3 positions shown; findings below may reference images not displayed]

FINDINGS: Diffuse soft tissue swelling. No gas in the soft tissues. No
radiopaque foreign body

Negative for fracture.  Mild degenerative change D IP
IMPRESSION: Negative for fracture.  Diffuse soft tissue swelling

## 2023-09-04 ENCOUNTER — Telehealth (INDEPENDENT_AMBULATORY_CARE_PROVIDER_SITE_OTHER): Payer: Self-pay | Admitting: Otolaryngology

## 2023-09-04 NOTE — Telephone Encounter (Signed)
 Confirmed appt & location [id number] afm

## 2023-09-05 ENCOUNTER — Ambulatory Visit (INDEPENDENT_AMBULATORY_CARE_PROVIDER_SITE_OTHER): Payer: MEDICAID | Admitting: Otolaryngology

## 2023-09-05 ENCOUNTER — Encounter (INDEPENDENT_AMBULATORY_CARE_PROVIDER_SITE_OTHER): Payer: Self-pay | Admitting: Otolaryngology

## 2023-09-05 VITALS — BP 146/85 | HR 90

## 2023-09-05 DIAGNOSIS — R490 Dysphonia: Secondary | ICD-10-CM | POA: Diagnosis not present

## 2023-09-05 DIAGNOSIS — J381 Polyp of vocal cord and larynx: Secondary | ICD-10-CM

## 2023-09-05 DIAGNOSIS — R042 Hemoptysis: Secondary | ICD-10-CM

## 2023-09-05 DIAGNOSIS — J370 Chronic laryngitis: Secondary | ICD-10-CM

## 2023-09-05 DIAGNOSIS — F17210 Nicotine dependence, cigarettes, uncomplicated: Secondary | ICD-10-CM | POA: Diagnosis not present

## 2023-09-05 DIAGNOSIS — Z720 Tobacco use: Secondary | ICD-10-CM

## 2023-09-05 MED ORDER — SULFAMETHOXAZOLE-TRIMETHOPRIM 800-160 MG PO TABS: 1.0000 | ORAL_TABLET | Freq: Two times a day (BID) | ORAL | 0 refills | Status: AC

## 2023-09-05 MED ORDER — FLUCONAZOLE 100 MG PO TABS: 100.0000 mg | ORAL_TABLET | Freq: Every day | ORAL | 0 refills | Status: AC

## 2023-09-05 MED ORDER — METHYLPREDNISOLONE 4 MG PO TBPK: ORAL_TABLET | ORAL | 1 refills | Status: DC

## 2023-09-05 NOTE — Progress Notes (Signed)
 ENT CONSULT:  Reason for Consult: dysphonia hx of smoking    HPI: Discussed the use of AI scribe software for clinical note transcription with the patient, who gave verbal consent to proceed.  History of Present Illness Levi Campbell is a 63 year old male active smoker (2 PPD) with COPD who presents with coughing up blood and hoarseness.  He has been experiencing intermittent hemoptysis for about a year. Despite the persistence of this symptom, he has not yet discussed it with his pulmonary doctor, Dr Darlean. He denies being on any blood thinners.  He experiences hoarseness, which varies with his level of activity and nasal breathing. This symptom has been present for approximately six months. He takes allergy pill daily.   He has a long history of smoking, having started at the age of [age]. Over the past three years, he has been attempting to reduce his smoking, transitioning from full-flavored cigarettes to lights. Despite his efforts, he still smokes about ten packs of cigarettes a week. He recently quit smoking marijuana and has successfully quit other substances in the past, such as heroin, without difficulty.  Records Reviewed:  Office visit Dr Darlean Pulm 07/01/23 Brief patient profile:  63 yowm active smoker/MM  referred to pulmonary clinic in [place]  02/11/2023 by Charmaine Melia NP   for doe onset in 1990s  using prn saba  initially then started on symbicort which even on 80 strength bothers throat along with overt HB    Noted to have flattened flow-volume loop on testing and sent here for that and hoarseness     Past Medical History:  Diagnosis Date   Alcohol abuse    quit 1 year ago   COPD (chronic obstructive pulmonary disease) (HCC)    Hepatitis C infection    Hypertension    Opiate addiction (HCC)     Past Surgical History:  Procedure Laterality Date   CHOLECYSTECTOMY     KNEE SURGERY Left     Family History  Problem Relation Age of Onset   Stroke Mother     Cancer - Ovarian Mother    Colitis Mother    Heart attack Father    Venous thrombosis Sister    Heart failure Sister    Other Sister        gastric bypass x2   Alcohol abuse Sister    Kidney cancer Brother    Bladder Cancer Brother     Social History:  reports that he has been smoking cigarettes. He does not have any smokeless tobacco history on file. He reports that he does not currently use alcohol after a past usage of about 2.0 - 4.0 standard drinks of alcohol per week. He reports current drug use. Drugs: Marijuana, Methamphetamines, and Cocaine.  Allergies:  Allergies  Allergen Reactions   Tape Dermatitis, Itching and Rash    FOAM TAPE CAUSED BLISTERS   Codeine Hives    Medications: I have reviewed the patient's current medications.  The PMH, PSH, Medications, Allergies, and SH were reviewed and updated.  ROS: Constitutional: Negative for fever, weight loss and weight gain. Cardiovascular: Negative for chest pain and dyspnea on exertion. Respiratory: Is not experiencing shortness of breath at rest. Gastrointestinal: Negative for nausea and vomiting. Neurological: Negative for headaches. Psychiatric: The patient is not nervous/anxious  There were no vitals taken for this visit. There is no height or weight on file to calculate BMI.  PHYSICAL EXAM:  Exam: General: Well-developed, well-nourished Communication and Voice: raspy Respiratory Respiratory  effort: Equal inspiration and expiration without stridor Cardiovascular Peripheral Vascular: Warm extremities with equal color/perfusion Eyes: No nystagmus with equal extraocular motion bilaterally Neuro/Psych/Balance: Patient oriented to person, place, and time; Appropriate mood and affect; Gait is intact with no imbalance; Cranial nerves I-XII are intact Head and Face Inspection: Normocephalic and atraumatic without mass or lesion Palpation: Facial skeleton intact without bony stepoffs Salivary Glands: No mass or  tenderness Facial Strength: Facial motility symmetric and full bilaterally ENT Pinna: External ear intact and fully developed External canal: Canal is patent with intact skin Tympanic Membrane: Clear and mobile External Nose: No scar or anatomic deformity Internal Nose: Septum is deviated to the right. No polyp, or purulence. Mucosal edema and erythema present.  Bilateral inferior turbinate hypertrophy.  Lips, Teeth, and gums: Mucosa and teeth intact and viable TMJ: No pain to palpation with full mobility Oral cavity/oropharynx: No erythema or exudate, no lesions present Nasopharynx: No mass or lesion with intact mucosa Hypopharynx: Intact mucosa without pooling of secretions Larynx Glottic: Full true vocal cord mobility without lesion or mass bilateral Reinke's edema, non obstructive Supraglottic: Normal appearing epiglottis and AE folds Interarytenoid Space: Moderate pachydermia&edema Subglottic Space: Patent without lesion or edema no scar or narrowing  Neck Neck and Trachea: Midline trachea without mass or lesion Thyroid: No mass or nodularity Lymphatics: No lymphadenopathy  Procedure:  Preoperative diagnosis: hoarseness  Postoperative diagnosis:   same + Reinke's edema + chronic laryngitis (crusting along left false fold  Procedure: Flexible fiberoptic laryngoscopy with stroboscopy (68420)   Surgeon: Rada Zegers, MD  Anesthesia: Topical lidocaine  and Afrin  Complications: None  Condition is stable throughout exam  Indications and consent:   The patient presents to the clinic with hoarseness. All the risks, benefits, and potential complications were reviewed with the patient preoperatively and informed verbal consent was obtained.  Procedure: The patient was seated upright in the exam chair.   Topical lidocaine  and Afrin were applied to the nasal cavity. After adequate anesthesia had occurred, the flexible telescope with strobe capabilities was passed into the nasal  cavity. The nasopharynx was patent without mass or lesion. The scope was passed behind the soft palate and directed toward the base of tongue. The base of tongue was visualized and was symmetric with no apparent masses or abnormal appearing tissue. There were no signs of a mass or pooling of secretions in the piriform sinuses. The supraglottic structures were normal.  The true vocal cords are mobile. The medial edges were  with Reinke's polypoid changes. Closure was complete with supraglottic compression. Periodicity present. The mucosal wave and amplitude were decreased diffusely. There is moderate interarytenoid pachydermia and post cricoid edema. The mucosa appears with crusting along left false fold.   The laryngoscope was then slowly withdrawn and the patient tolerated the procedure well. There were no complications or blood loss.   Studies Reviewed: CXR 02/16/23 FINDINGS: The heart size and mediastinal contours are within normal limits. Mild scarring noted at left lung base The lungs are otherwise clear. Visualized skeletal structures are unremarkable.   IMPRESSION: No active cardiopulmonary disease.    Assessment/Plan: Encounter Diagnoses  Name Primary?   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 Smoking cessation crucial to prevent recurrence. - Prescribed a combination of Bactrim, Diflucan and Medrol pack for chronic laryngitis  - RTC 3 months for repeat scope exam  Hemoptysis Intermittent hemoptysis for one year with no upper airway bleeding  source identified.  No prior chest imaging. At risk for lung cancer. Differential includes potential lung cancer due to smoking history and COPD.  - Schedule appointment with pulmonologist Dr Darlean for further evaluation of hemoptysis. - upper airway exam today without epistaxis or other sources of bleeding in upper airway   Tobacco use disorder.  We had an extensive discussion about detrimental effects of smoking on overall health. I provided resources available at [facility] to assist with smoking cessation. I spent 4 min on counseling  - Advised smoking cessation  Thank you for allowing me to participate in the care of this patient. Please do not hesitate to contact me with any questions or concerns.   Elena Larry, MD Otolaryngology [facility] Phone: [phone] Fax: [phone]    09/05/2023, 7:49 AM

## 2023-09-05 NOTE — Patient Instructions (Signed)
 Dear Levi Campbell,   Congratulations for your interest in quitting smoking!  Find a program that suits you best: when you want to quit, how you need support, where you live, and how you like to learn.    If you're ready to get started TODAY, consider scheduling a visit through [facility] [email or web address].  Appointments are available from 8am to 8pm, Monday to Friday.   Most health insurance plans will cover some level of tobacco cessation visits and medications.    Additional Resources: [employer] are also available to help you quit & provide the support you'll need. Many programs are available in both [place] and Spanish and have a long history of successfully helping people get off and stay off tobacco.    Quit Smoking Apps:  quitSTART at [employer]iousBroker.de QuitGuide?at ForgetParking.dk Online education and resources: Smokefree  at [employer].gov Free Telephone Coaching: QuitNow,  Call 1-800-QUIT-NOW (818 063 8344) or Text- Ready to 8078294079 *Quitline [place] has teamed up with Medicaid to offer a free 14 week program    Vaping- Want to Quit? Free 24/7 support. Call [facility]  [place], [place], [place], [place], [place]  [facility]

## 2023-10-01 ENCOUNTER — Ambulatory Visit: Payer: MEDICAID | Admitting: Internal Medicine

## 2023-10-01 ENCOUNTER — Encounter: Payer: Self-pay | Admitting: Internal Medicine

## 2023-10-01 VITALS — BP 125/79 | HR 97 | Ht 69.0 in | Wt 256.8 lb

## 2023-10-01 DIAGNOSIS — R490 Dysphonia: Secondary | ICD-10-CM | POA: Diagnosis not present

## 2023-10-01 DIAGNOSIS — F17210 Nicotine dependence, cigarettes, uncomplicated: Secondary | ICD-10-CM | POA: Diagnosis not present

## 2023-10-01 DIAGNOSIS — J449 Chronic obstructive pulmonary disease, unspecified: Secondary | ICD-10-CM

## 2023-10-01 NOTE — Patient Instructions (Addendum)
 The key is to stop smoking completely before smoking completely stops you!  My office will be contacting you by phone for referral to lung cancer screening   [phone] - if you don't hear back from my office within one week,  please call us  back or notify us  thru MyChart and we'll address it right away.    Follow up with your ENT doctor ? Need for allergy eval next?  But finish with her first    Please schedule a follow up visit in 3 months but call sooner if needed

## 2023-10-01 NOTE — Progress Notes (Signed)
 Levi Campbell, male    DOB: 1960/04/19    MRN: [id number]   Brief patient profile:  76 yowm active smoker/MM  referred to pulmonary clinic in [place]  02/11/2023 by Charmaine Melia NP   for doe onset in 1990s  using prn saba  initially then started on symbicort which even on 80 strength bothers throat along with overt HB      History of Present Illness  02/11/2023  Pulmonary/ 1st office eval/ Farha Dano / [place] Office maint on symbicot 80 2bid / ACEi  Chief Complaint  Patient presents with   Establish Care   Shortness of Breath  Dyspnea:  more limited by legs / hips than breathing at present   Cough: variable with temp but really 24/7 yellow and occ blood streaks  Sleep: bed is flat, 3 pillows under head 3/7 nights coughing  SABA use: none today  02: none  LDSCT:ordered  Bad HB best rx omeprazole 20  did no think  pepcid  rec by gi worked as well  Rec Stop lisinopril and replace with olmesartan  20 mg one daily  My office will be contacting you by phone for referral to lung cancer sreeening  [phone]   and PFTs For cough/ congestion > mucinex dm 1200 mg every 12 hours as needed  Work on inhaler technique:  Your can use the Symbicort 80  up to 2 puffs every 12 hours   Cxr nl     The key is to stop smoking completely before smoking completely stops you!  Allergy screen 02/11/2023 >  Eos 0.1   Alpha one AT phenotype MM  level 249     07/01/2023  f/u ov/[place] office/James Senn re: gold 2  maint on SYMBICORT   Chief Complaint  Patient presents with   Shortness of Breath   Dyspnea:  LEANS cart  on [employer] / very short of breath bending over. Also limited by back and hip pain  Cough: better off acei but still very raspy assoc with sensation of pnds Sleeping: 30 degrees recliner   s resp cc  SABA use: 2x weekly  02: none  Lung cancer screening: referred 07/01/2023  Rec Handicap sticker today  The key is to stop smoking completely before smoking completely stops you - it's not  too late! 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 Smoking cessation crucial to prevent recurrence. - Prescribed a combination of Bactrim , Diflucan  and Medrol  pack for chronic laryngitis    10/01/2023  f/u ov/[place] office/Taylor Spilde re: GOLD 2  maint on symbicort  160 one bid  still smoking and minimizes any significant hemoptysis  once a month  Chief Complaint  Patient presents with   COPD    F/u   Dyspnea:  still doing [employer] shopping leaning on cart Cough: min  mucoid mostly in am's assoc with nasal congestion and sense of pnds some better p ET rx with short course pred  Sleeping: flat bed with bunch of pillows s resp cc  SABA use: not using  02: none  Lung cancer screening: referred again today    No obvious day to day or daytime variability or assoc excess/ purulent sputum or mucus plugs  or cp or chest tightness, subjective wheeze or overt  hb symptoms.    Also denies any obvious fluctuation of symptoms with weather or environmental changes or other aggravating or alleviating factors except as outlined above   No unusual exposure hx or h/o childhood pna/ asthma or knowledge of premature birth.  Current Allergies, Complete Past Medical History, Past Surgical History, Family History, and Social History were reviewed in [employer] record.  ROS  The following are not active complaints unless bolded Hoarseness, sore throat/globus sensation , dysphagia, dental problems, itching, sneezing,  nasal  congestion or discharge of excess mucus or purulent secretions, ear ache,   fever, chills, sweats, unintended wt loss or wt gain, classically pleuritic or exertional cp,  orthopnea pnd or arm/hand swelling  or leg swelling, presyncope, palpitations, abdominal pain, anorexia, nausea, vomiting, diarrhea  or change in bowel habits or change in bladder habits, change in stools or change in urine, dysuria, hematuria,  rash, arthralgias, visual complaints, headache, numbness, weakness or ataxia or problems with walking or coordination,  change in mood or  memory.        Current Meds -  - NOTE:   Unable to verify as accurately reflecting what pt takes    Medication Sig   budesonide-formoterol (SYMBICORT) 80-4.5 MCG/ACT inhaler Inhale 2 puffs into the lungs as needed.   cetirizine (ZYRTEC) 10 MG tablet TAKE 1 TABLET BY MOUTH DAILY for 30   cycloSPORINE (RESTASIS) 0.05 % ophthalmic emulsion 1 drop 2 (two) times daily.   diazepam (VALIUM) 2 MG tablet Take 2 mg by mouth daily as needed.   fluconazole  (DIFLUCAN ) 100 MG tablet Take 1 tablet (100 mg total) by mouth daily.   methylPREDNISolone  (MEDROL  DOSEPAK) 4 MG TBPK tablet Take with signs of chronic sinusitis and take as directed   naloxone (NARCAN) nasal spray 4 mg/0.1 mL Place 1 spray into the nose once.   olmesartan  (BENICAR ) 20 MG tablet Take 1 tablet (20 mg total) by mouth daily.   OVER THE COUNTER MEDICATION Instaflex joint support   oxyCODONE-acetaminophen (PERCOCET) 7.5-325 MG tablet Take 1 tablet by mouth every 6 (six) hours.   rosuvastatin (CRESTOR) 20 MG tablet Take 20 mg by mouth daily.   sulfamethoxazole -trimethoprim  (BACTRIM  DS) 800-160 MG tablet Take 1 tablet by mouth 2 (two) times daily.   SYMBICORT 80-4.5 MCG/ACT inhaler Inhale 2 puffs into the lungs daily.   Vitamin D, Ergocalciferol, (DRISDOL) 1.25 MG (50000 UNIT) CAPS capsule Take 50,000 Units by mouth once a week.          Past Medical History:  Diagnosis Date   Alcohol abuse     quit 1 year ago   COPD (chronic obstructive pulmonary disease) (HCC)    Hepatitis C infection    Hypertension    Opiate addiction (HCC)       Objective:    Wts   10/01/2023       256   07/01/23 246 lb (111.6 kg)  05/08/23 235 lb 0.2 oz (106.6 kg)  04/14/23 235 lb (106.6 kg)    Vital signs reviewed  10/01/2023  - Note at rest 02 sats  93% on RA    General appearance:    amb hoarse  mod obese (by BMI) wm with nasal tone to voice and obvious pseudowheezing    HEENT : Oropharynx  clear/ full dentures     Nasal turbinates mod edema   NECK :  without  apparent JVD/ palpable Nodes/TM    LUNGS: no acc muscle use,  Nl contour chest which is clear to A and P bilaterally without cough on insp or exp maneuvers   CV:  RRR  no s3 or murmur or increase in P2, and no edema   ABD:  soft and nontender   MS:  Gait nl   ext warm without deformities Or obvious joint restrictions  calf tenderness, cyanosis or clubbing    SKIN: warm and dry without lesions    NEURO:  alert, approp, nl sensorium with  no motor or cerebellar deficits apparent.      Assessment   Assessment & Plan COPD GOLD ? 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 -  PFT's  07/01/2023   FEV1 2.20 (64 % ) ratio 0.49  p 0 % improvement from saba p symbicort 80  prior to study with DLCO  19.25  (72%)   and FV curve very flat insp and exp  > continue symb 80 2bid   >>>  10/01/2023 added spacer so can use up his symbicort 160 1-2 bid  Cigarette smoker Still actively smoking  >>> referred again for LCS  >>> Counseled re importance of smoking cessation but did not meet time criteria for separate billing     F/u q 3 m   Hoarseness Ent referreal 07/01/2023 for hoarseness and flattened f/v loop  - 09/05/23 ENT eval for hoarseness   Chronic dysphonia, hx of smoking  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 Smoking cessation crucial to prevent recurrence. - Prescribed a combination of Bactrim , Diflucan  and Medrol  pack for chronic laryngitis >transient improvement  Only maybe 50% improved transiently and not sure he's compliant with any of my previous recs so prefer he return to ENT for re-eval          Each maintenance medication was reviewed in detail including emphasizing most importantly the difference between maintenance and prns and under what circumstances the prns are to be triggered using an action plan format where appropriate.  Total time for H and P, chart review, counseling, reviewing hfa/spacer  device(s) and generating customized AVS unique to this office visit / same day charting =         AVS  Patient Instructions  The key is to stop smoking completely before smoking completely stops you!  My office will be contacting you by phone for referral to lung cancer screening   [phone] - if you don't hear back from my office within one week,  please call us  back or notify us  thru MyChart and we'll address it right away.    Follow up with your ENT doctor ? Need for allergy eval next?  But finish with her first    Please schedule a follow up visit in 3 months but call sooner if needed            Ozell America, MD 10/01/2023

## 2023-10-01 NOTE — Assessment & Plan Note (Addendum)
 Still actively smoking  >>> referred again for LCS  >>> Counseled re importance of smoking cessation but did not meet time criteria for separate billing     F/u q 3 m

## 2023-10-01 NOTE — Assessment & Plan Note (Addendum)
 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

## 2023-10-01 NOTE — Assessment & Plan Note (Addendum)
 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 Smoking cessation crucial to prevent recurrence. - Prescribed a combination of Bactrim , Diflucan  and Medrol  pack for chronic laryngitis >transient improvement  Only maybe 50% improved transiently and not sure he's compliant with any of my previous recs so prefer he return to ENT for re-eval          Each maintenance medication was reviewed in detail including emphasizing most importantly the difference between maintenance and prns and under what circumstances the prns are to be triggered using an action plan format where appropriate.  Total time for H and P, chart review, counseling, reviewing hfa/spacer  device(s) and generating customized AVS unique to this office visit / same day charting = 

## 2023-10-08 ENCOUNTER — Telehealth: Payer: Self-pay | Admitting: Internal Medicine

## 2023-10-08 ENCOUNTER — Telehealth (HOSPITAL_BASED_OUTPATIENT_CLINIC_OR_DEPARTMENT_OTHER): Payer: Self-pay

## 2023-10-08 DIAGNOSIS — Z87891 Personal history of nicotine dependence: Secondary | ICD-10-CM

## 2023-10-08 DIAGNOSIS — F17210 Nicotine dependence, cigarettes, uncomplicated: Secondary | ICD-10-CM

## 2023-10-08 DIAGNOSIS — Z122 Encounter for screening for malignant neoplasm of respiratory organs: Secondary | ICD-10-CM

## 2023-10-08 NOTE — Telephone Encounter (Signed)
 Copied from CRM #[id number]. Topic: Appointments - Scheduling Inquiry for Clinic >> Oct 08, 2023 12:03 PM [name] wrote: Reason for CRM: Patient returning [facility] phone call regarding schedule CT scan screening, contacted [facility] unable to speak with anyone, advised patient Levi Campbell will f/u with him. Patient was thankful and verbalized understanding.

## 2023-10-08 NOTE — Telephone Encounter (Signed)
 Lung Cancer Screening Narrative/Criteria Questionnaire (Cigarette Smokers Only- No Cigars/Pipes/vapes)   Levi Campbell   SDMV:10/13/2023 9:15 [place]     1960/04/28   LDCT: 10/22/2023 10:30 Levi Campbell    63 y.o.   Phone: [phone]  Lung Screening Narrative (confirm age 28-77 yrs Medicare / 50-80 yrs Private pay insurance)   Insurance information:mcd and [facility]   Referring Provider:Dr. Darlean   This screening involves an initial phone call with a team member from our program. It is called a shared decision making visit. The initial meeting is required by  insurance and Medicare to make sure you understand the program. This appointment takes about 15-20 minutes to complete. You will complete the screening scan at your scheduled date/time.  This scan takes about 5-10 minutes to complete. You can eat and drink normally before and after the scan.  Criteria questions for Lung Cancer Screening:   Are you a current or former smoker? Current Age began smoking: 63yo   If you are a former smoker, what year did you quit smoking? N/A(within 15 yrs)   To calculate your smoking history, I need an accurate estimate of how many packs of cigarettes you smoked per day and for how many years. (Not just the number of PPD you are now smoking)   Years smoking 55 x Packs per day 1.75 = Pack years 96.25   (at least 20 pack yrs)   (Make sure they understand that we need to know how much they have smoked in the past, not just the number of PPD they are smoking now)  Do you have a personal history of cancer?  No    Do you have a family history of cancer? Yes  (cancer type and and relative) Brother - bladder and renal  Are you coughing up blood?  No  Have you had unexplained weight loss of 15 lbs or more in the last 6 months? No  It looks like you meet all criteria.  When would be a good time for us  to schedule you for this screening?   Additional information: N/A

## 2023-10-08 NOTE — Telephone Encounter (Signed)
 Atc x1 lmtcb

## 2023-10-08 NOTE — Telephone Encounter (Unsigned)
 Copied from CRM #[id number]. Topic: Appointments - Scheduling Inquiry for Clinic >> Oct 07, 2023  5:33 PM Rozanna G wrote: Reason for CRM: PT CALLED TO SCHEDULE HIS LUNG SCREENING CT HE IS A LITTLE CONFUSED STATED HE DID KNOW HE HD TO HAVE ONE

## 2023-10-09 ENCOUNTER — Telehealth: Payer: Self-pay

## 2023-10-09 NOTE — Telephone Encounter (Signed)
 Atc x2 closing enct

## 2023-10-09 NOTE — Telephone Encounter (Signed)
 Informed pt of what the LCS is . nfn

## 2023-10-09 NOTE — Telephone Encounter (Signed)
 CT was scheduled for 10/22/2023

## 2023-10-13 ENCOUNTER — Encounter: Payer: Self-pay | Admitting: Adult Health

## 2023-10-13 ENCOUNTER — Ambulatory Visit: Payer: MEDICAID | Admitting: Adult Health

## 2023-10-13 DIAGNOSIS — F17210 Nicotine dependence, cigarettes, uncomplicated: Secondary | ICD-10-CM | POA: Diagnosis not present

## 2023-10-13 NOTE — Progress Notes (Signed)
  Virtual Visit via Telephone Note  I connected with [place] , 10/13/23 9:11 AM by a telemedicine application and verified that I am speaking with the correct person using two identifiers.  Location: Patient: home Provider: home   I discussed the limitations of evaluation and management by telemedicine and the availability of in person appointments. The patient expressed understanding and agreed to proceed.   Shared Decision Making Visit Lung Cancer Screening Program [phone])   Eligibility: 63 y.o. Pack Years Smoking History Calculation = 96 pack years  (# packs/per year x # years smoked) Recent History of coughing up blood  no Unexplained weight loss? no ( >Than 15 pounds within the last 6 months ) Prior History Lung / other cancer no (Diagnosis within the last 5 years already requiring surveillance chest CT Scans). Smoking Status Current Smoker  Visit Components: Discussion included one or more decision making aids. YES Discussion included risk/benefits of screening. YES Discussion included potential follow up diagnostic testing for abnormal scans. YES Discussion included meaning and risk of over diagnosis. YES Discussion included meaning and risk of False Positives. YES Discussion included meaning of total radiation exposure. YES  Counseling Included: Importance of adherence to annual lung cancer LDCT screening. YES Impact of comorbidities on ability to participate in the program. YES Ability and willingness to under diagnostic treatment. YES  Smoking Cessation Counseling: Current Smokers:  Discussed importance of smoking cessation. yes Information about tobacco cessation classes and interventions provided to patient. yes Patient provided with ticket for LDCT Scan. yes Symptomatic Patient. NO Diagnosis Code: Tobacco Use Z72.0 Asymptomatic Patient yes  Counseling - 4 minutes of smoking cessation counseling (CT Chest Lung Cancer Screening Low Dose W/O CM)  [id number]  Z12.2-Screening of respiratory organs Z87.891-Personal history of nicotine dependence   Lamarr Myers 10/13/23

## 2023-10-13 NOTE — Patient Instructions (Signed)

## 2023-10-22 ENCOUNTER — Ambulatory Visit (HOSPITAL_COMMUNITY)
Admission: RE | Admit: 2023-10-22 | Discharge: 2023-10-22 | Disposition: A | Discharge status: Home / Self Care | Payer: MEDICAID | Source: Ambulatory Visit | Attending: Acute Care | Admitting: Acute Care

## 2023-10-22 DIAGNOSIS — Z87891 Personal history of nicotine dependence: Secondary | ICD-10-CM | POA: Insufficient documentation

## 2023-10-22 DIAGNOSIS — F17210 Nicotine dependence, cigarettes, uncomplicated: Secondary | ICD-10-CM | POA: Insufficient documentation

## 2023-10-22 DIAGNOSIS — Z122 Encounter for screening for malignant neoplasm of respiratory organs: Secondary | ICD-10-CM | POA: Diagnosis present

## 2023-10-29 ENCOUNTER — Other Ambulatory Visit: Payer: Self-pay | Admitting: Acute Care

## 2023-10-29 DIAGNOSIS — Z87891 Personal history of nicotine dependence: Secondary | ICD-10-CM

## 2023-10-29 DIAGNOSIS — Z122 Encounter for screening for malignant neoplasm of respiratory organs: Secondary | ICD-10-CM

## 2023-12-08 ENCOUNTER — Ambulatory Visit (INDEPENDENT_AMBULATORY_CARE_PROVIDER_SITE_OTHER): Payer: MEDICAID | Admitting: Otolaryngology

## 2024-01-05 ENCOUNTER — Ambulatory Visit: Payer: MEDICAID | Admitting: Internal Medicine

## 2024-01-05 DIAGNOSIS — J449 Chronic obstructive pulmonary disease, unspecified: Secondary | ICD-10-CM

## 2024-01-05 NOTE — Progress Notes (Deleted)
 Levi Campbell, male    DOB: 28-Aug-1960    MRN: [id number]   Brief patient profile:  25 yowm active smoker/MM  referred to pulmonary clinic in [place]  02/11/2023 by Charmaine Melia NP   for doe onset in 1990s  using prn saba  initially then started on symbicort which even on 80 strength bothers throat along with overt HB      History of Present Illness  02/11/2023  Pulmonary/ 1st office eval/ Levi Campbell / [place] [facility] maint on symbicot 80 2bid / ACEi  Chief Complaint  Patient presents with   Establish Care   Shortness of Breath  Dyspnea:  more limited by legs / hips than breathing at present   Cough: variable with temp but really 24/7 yellow and occ blood streaks  Sleep: bed is flat, 3 pillows under head 3/7 nights coughing  SABA use: none today  02: none  LDSCT:ordered  Bad HB best rx omeprazole 20  did no think  pepcid  rec by gi worked as well  Rec Stop lisinopril and replace with olmesartan  20 mg one daily  My office will be contacting you by phone for referral to lung cancer sreeening  [phone]   and PFTs For cough/ congestion > mucinex dm 1200 mg every 12 hours as needed  Work on inhaler technique:  Your can use the Symbicort 80  up to 2 puffs every 12 hours   Cxr nl     The key is to stop smoking completely before smoking completely stops you!  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 Smoking cessation crucial to prevent recurrence. - Prescribed a combination of Bactrim , Diflucan  and Medrol  pack for chronic laryngitis    10/01/2023   f/u ov/[place] office/Levi Campbell re: GOLD 2  maint on symbicort  160 one bid  still smoking and minimizes any significant hemoptysis  once a month  Chief Complaint  Patient presents with   COPD    F/u   Dyspnea:  still doing [employer] shopping leaning on cart Cough: min  mucoid mostly in am's assoc with nasal congestion and sense of pnds some better p ET rx with short course pred  Sleeping: flat bed with bunch of pillows s resp cc  SABA use: not using  02: none  Lung cancer screening: referred again today  Patient Instructions  The key is to stop smoking completely before smoking completely stops you! My office will be contacting you by phone for referral to lung cancer screening   [phone]) - if you don't hear back from my office within one week,  please call us  back or notify us  thru MyChart and we'll address it right away.   Follow up with your ENT doctor ? Need for allergy eval next?     Please schedule a follow up visit in 3 months but call sooner if needed      01/05/2024 3 m  f/u ov/ office/Levi Campbell re: *** maint on ***  COPD GOLD 2  *** smoker  maint on  No  chief complaint on file.   Dyspnea:  *** Cough: *** Sleeping: ***   resp cc  SABA use: *** 02: ***  Lung cancer screening: ***   No obvious day to day or daytime variability or assoc excess/ purulent sputum or mucus plugs or hemoptysis or cp or chest tightness, subjective wheeze or overt sinus or hb symptoms.    Also denies any obvious fluctuation of symptoms with weather or environmental changes or other aggravating or alleviating factors except as outlined above   No unusual exposure hx or h/o childhood pna/ asthma or knowledge of premature birth.  Current Allergies, Complete Past Medical History, Past Surgical History, Family History, and Social History were reviewed in [employer] record.  ROS  The following are not active complaints unless bolded Hoarseness, sore throat,  dysphagia, dental problems, itching, sneezing,  nasal congestion or discharge of excess mucus or purulent secretions, ear ache,   fever, chills, sweats, unintended wt loss or wt gain, classically pleuritic or exertional cp,  orthopnea pnd or arm/hand swelling  or leg swelling, presyncope, palpitations, abdominal pain, anorexia, nausea, vomiting, diarrhea  or change in bowel habits or change in bladder habits, change in stools or change in urine, dysuria, hematuria,  rash, arthralgias, visual complaints, headache, numbness, weakness or ataxia or problems with walking or coordination,  change in mood or  memory.        No outpatient medications have been marked as taking for the 01/05/24 encounter (Appointment) with Darlean Ozell NOVAK, MD.                         Past Medical History:  Diagnosis Date   Alcohol abuse    quit 1 year ago   COPD (chronic obstructive pulmonary disease) (HCC)    Hepatitis C infection    Hypertension    Opiate addiction (HCC)       Objective:    Wts  01/05/2024        ***  10/01/2023       256   07/01/23 246 lb (111.6 kg)  05/08/23 235 lb 0.2 oz (106.6 kg)  04/14/23 235 lb (106.6 kg)   Vital signs reviewed  01/05/2024  - Note at rest 02 sats  ***% on ***   General appearance:    ***   full dentures***             Assessment

## 2024-02-03 ENCOUNTER — Ambulatory Visit: Payer: MEDICAID | Admitting: Internal Medicine

## 2024-02-03 ENCOUNTER — Encounter: Payer: Self-pay | Admitting: Internal Medicine

## 2024-02-03 VITALS — BP 129/86 | HR 102 | Ht 69.0 in | Wt 284.0 lb

## 2024-02-03 DIAGNOSIS — J449 Chronic obstructive pulmonary disease, unspecified: Secondary | ICD-10-CM

## 2024-02-03 DIAGNOSIS — F17210 Nicotine dependence, cigarettes, uncomplicated: Secondary | ICD-10-CM | POA: Diagnosis not present
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
## 2024-02-03 NOTE — Patient Instructions (Addendum)
 Ok to use symbicort up to 160 Take 2 puffs first thing in am and then another 2 puffs about 12 hours later.   Keep up with your CT scans every September and let your Primary doctor fill your symbicort 160   Follow up here is as needed

## 2024-02-03 NOTE — Assessment & Plan Note (Addendum)
 5  min discussion re active cigarette smoking in addition to office E&M  Ask about tobacco use:   ongoing  Advise quitting   I took an extended  opportunity with this patient to outline the consequences of continued cigarette use  in airway disorders based on all the data we have from the multiple national lung health studies (perfomed over decades at millions of dollars in cost)  indicating that smoking cessation, not choice of inhalers or pulmonary physicians, is the most important aspect of his  care.   Assess willingness:  Not committed at this point Assist in quit attempt:  Per PCP when ready Arrange follow up:   Follow up per Primary Care planned

## 2024-02-03 NOTE — Assessment & Plan Note (Addendum)
 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  Pulmonary f/u can be prn
